# Patient Record
Sex: Male | Born: 1980 | Race: White | Hispanic: No | Marital: Married | Smoking: Current every day smoker
Health system: Southern US, Community
[De-identification: ages and names within clinical notes are randomized; demographics above are authoritative.]

## PROBLEM LIST (undated history)

## (undated) DIAGNOSIS — R079 Chest pain, unspecified: Secondary | ICD-10-CM

## (undated) DIAGNOSIS — A4902 Methicillin resistant Staphylococcus aureus infection, unspecified site: Secondary | ICD-10-CM

## (undated) DIAGNOSIS — Z720 Tobacco use: Secondary | ICD-10-CM

## (undated) DIAGNOSIS — L509 Urticaria, unspecified: Secondary | ICD-10-CM

## (undated) DIAGNOSIS — K219 Gastro-esophageal reflux disease without esophagitis: Secondary | ICD-10-CM

## (undated) DIAGNOSIS — D696 Thrombocytopenia, unspecified: Secondary | ICD-10-CM

## (undated) DIAGNOSIS — Z8781 Personal history of (healed) traumatic fracture: Secondary | ICD-10-CM

## (undated) DIAGNOSIS — I10 Essential (primary) hypertension: Secondary | ICD-10-CM

## (undated) DIAGNOSIS — T783XXA Angioneurotic edema, initial encounter: Secondary | ICD-10-CM

## (undated) HISTORY — DX: Urticaria, unspecified: L50.9

## (undated) HISTORY — PX: UMBILICAL HERNIA REPAIR: SHX196

## (undated) HISTORY — PX: OTHER SURGICAL HISTORY: SHX169

## (undated) HISTORY — DX: Angioneurotic edema, initial encounter: T78.3XXA

---

## 2004-04-04 ENCOUNTER — Ambulatory Visit (HOSPITAL_BASED_OUTPATIENT_CLINIC_OR_DEPARTMENT_OTHER): Admission: RE | Admit: 2004-04-04 | Discharge: 2004-04-04 | Payer: Self-pay | Admitting: Orthopedic Surgery

## 2004-04-04 ENCOUNTER — Ambulatory Visit (HOSPITAL_COMMUNITY): Admission: RE | Admit: 2004-04-04 | Discharge: 2004-04-04 | Payer: Self-pay | Admitting: Orthopedic Surgery

## 2004-04-05 ENCOUNTER — Ambulatory Visit: Payer: Self-pay | Admitting: Internal Medicine

## 2004-04-05 ENCOUNTER — Ambulatory Visit: Payer: Self-pay | Admitting: Infectious Diseases

## 2004-04-05 ENCOUNTER — Inpatient Hospital Stay (HOSPITAL_COMMUNITY): Admission: AD | Admit: 2004-04-05 | Discharge: 2004-04-11 | Payer: Self-pay | Admitting: Internal Medicine

## 2004-04-08 ENCOUNTER — Ambulatory Visit: Payer: Self-pay | Admitting: Infectious Diseases

## 2004-04-18 ENCOUNTER — Ambulatory Visit: Payer: Self-pay | Admitting: Infectious Diseases

## 2004-06-07 ENCOUNTER — Ambulatory Visit: Payer: Self-pay | Admitting: Infectious Diseases

## 2004-11-30 ENCOUNTER — Ambulatory Visit: Payer: Self-pay | Admitting: Internal Medicine

## 2005-01-01 ENCOUNTER — Ambulatory Visit: Payer: Self-pay | Admitting: Internal Medicine

## 2005-02-26 ENCOUNTER — Ambulatory Visit: Payer: Self-pay | Admitting: Infectious Diseases

## 2007-10-12 ENCOUNTER — Emergency Department (HOSPITAL_COMMUNITY): Admission: EM | Admit: 2007-10-12 | Discharge: 2007-10-12 | Payer: Self-pay | Admitting: Emergency Medicine

## 2010-06-13 ENCOUNTER — Observation Stay (HOSPITAL_COMMUNITY)
Admission: EM | Admit: 2010-06-13 | Discharge: 2010-06-14 | Payer: Self-pay | Source: Home / Self Care | Attending: Internal Medicine | Admitting: Internal Medicine

## 2010-06-14 LAB — POCT I-STAT, CHEM 8
BUN: 16 mg/dL (ref 6–23)
Calcium, Ion: 1.19 mmol/L (ref 1.12–1.32)
Chloride: 107 mEq/L (ref 96–112)
Creatinine, Ser: 2.5 mg/dL — ABNORMAL HIGH (ref 0.4–1.5)
Glucose, Bld: 86 mg/dL (ref 70–99)
HCT: 49 % (ref 39.0–52.0)
Hemoglobin: 16.7 g/dL (ref 13.0–17.0)
Potassium: 4.2 mEq/L (ref 3.5–5.1)
Sodium: 140 mEq/L (ref 135–145)
TCO2: 24 mmol/L (ref 0–100)

## 2010-06-14 LAB — CK TOTAL AND CKMB (NOT AT ARMC)
CK, MB: 1.6 ng/mL (ref 0.3–4.0)
Relative Index: INVALID (ref 0.0–2.5)
Total CK: 95 U/L (ref 7–232)

## 2010-06-14 LAB — RAPID URINE DRUG SCREEN, HOSP PERFORMED
Amphetamines: NOT DETECTED
Barbiturates: NOT DETECTED
Benzodiazepines: NOT DETECTED
Cocaine: NOT DETECTED
Opiates: NOT DETECTED
Tetrahydrocannabinol: POSITIVE — AB

## 2010-06-14 LAB — DIFFERENTIAL
Basophils Absolute: 0.1 10*3/uL (ref 0.0–0.1)
Basophils Relative: 1 % (ref 0–1)
Eosinophils Absolute: 0.3 10*3/uL (ref 0.0–0.7)
Eosinophils Relative: 2 % (ref 0–5)
Lymphocytes Relative: 13 % (ref 12–46)
Lymphs Abs: 1.9 10*3/uL (ref 0.7–4.0)
Monocytes Absolute: 0.7 10*3/uL (ref 0.1–1.0)
Monocytes Relative: 5 % (ref 3–12)
Neutro Abs: 11.8 10*3/uL — ABNORMAL HIGH (ref 1.7–7.7)
Neutrophils Relative %: 79 % — ABNORMAL HIGH (ref 43–77)

## 2010-06-14 LAB — POCT CARDIAC MARKERS
CKMB, poc: <1 ng/mL — ABNORMAL LOW (ref 1.0–8.0)
Myoglobin, poc: 87.5 ng/mL (ref 12–200)
Troponin i, poc: <0.05 ng/mL (ref 0.00–0.09)

## 2010-06-14 LAB — URINALYSIS, ROUTINE W REFLEX MICROSCOPIC
Bilirubin Urine: NEGATIVE
Hgb urine dipstick: NEGATIVE
Ketones, ur: NEGATIVE mg/dL
Nitrite: NEGATIVE
Protein, ur: NEGATIVE mg/dL
Specific Gravity, Urine: 1.018 (ref 1.005–1.030)
Urine Glucose, Fasting: NEGATIVE mg/dL
Urobilinogen, UA: 0.2 mg/dL (ref 0.0–1.0)
pH: 6 (ref 5.0–8.0)

## 2010-06-14 LAB — CARBOXYHEMOGLOBIN
Carboxyhemoglobin: 3.7 % — ABNORMAL HIGH (ref 0.5–1.5)
Methemoglobin: 1.4 % (ref 0.0–1.5)
O2 Saturation: 98.2 %
Total hemoglobin: 14.9 g/dL (ref 13.5–18.0)

## 2010-06-14 LAB — CBC
HCT: 48.2 % (ref 39.0–52.0)
Hemoglobin: 16 g/dL (ref 13.0–17.0)
MCH: 30.4 pg (ref 26.0–34.0)
MCHC: 33.2 g/dL (ref 30.0–36.0)
MCV: 91.6 fL (ref 78.0–100.0)
Platelets: 56 10*3/uL — ABNORMAL LOW (ref 150–400)
RBC: 5.26 MIL/uL (ref 4.22–5.81)
RDW: 13.7 % (ref 11.5–15.5)
WBC: 14.8 10*3/uL — ABNORMAL HIGH (ref 4.0–10.5)

## 2010-06-14 LAB — MRSA PCR SCREENING: MRSA by PCR: NEGATIVE
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
## 2010-06-19 LAB — VITAMIN B12: Vitamin B-12: 385 pg/mL (ref 211–911)

## 2010-06-19 LAB — BASIC METABOLIC PANEL
BUN: 11 mg/dL (ref 6–23)
CO2: 23 mEq/L (ref 19–32)
Calcium: 8.6 mg/dL (ref 8.4–10.5)
Chloride: 111 mEq/L (ref 96–112)
Creatinine, Ser: 1.1 mg/dL (ref 0.4–1.5)
GFR calc Af Amer: >60 mL/min (ref >60)
GFR calc non Af Amer: >60 mL/min (ref >60)
Glucose, Bld: 105 mg/dL — ABNORMAL HIGH (ref 70–99)
Potassium: 4 mEq/L (ref 3.5–5.1)
Sodium: 140 mEq/L (ref 135–145)

## 2010-06-19 LAB — HEPATIC FUNCTION PANEL
ALT: 11 U/L (ref 0–53)
AST: 19 U/L (ref 0–37)
Albumin: 3.6 g/dL (ref 3.5–5.2)
Alkaline Phosphatase: 54 U/L (ref 39–117)
Bilirubin, Direct: 0.1 mg/dL (ref 0.0–0.3)
Indirect Bilirubin: 0.4 mg/dL (ref 0.3–0.9)
Total Bilirubin: 0.5 mg/dL (ref 0.3–1.2)
Total Protein: 6.2 g/dL (ref 6.0–8.3)

## 2010-06-19 LAB — CARDIAC PANEL(CRET KIN+CKTOT+MB+TROPI)
CK, MB: 1.4 ng/mL (ref 0.3–4.0)
CK, MB: 1.1 ng/mL (ref 0.3–4.0)
Relative Index: INVALID (ref 0.0–2.5)
Relative Index: INVALID (ref 0.0–2.5)
Total CK: 85 U/L (ref 7–232)
Total CK: 90 U/L (ref 7–232)
Troponin I: 0.02 ng/mL (ref 0.00–0.06)
Troponin I: 0.01 ng/mL (ref 0.00–0.06)

## 2010-06-19 LAB — LIPID PANEL
HDL: 32 mg/dL — ABNORMAL LOW
Total CHOL/HDL Ratio: 3.4 ratio
Triglycerides: 93 mg/dL
VLDL: 19 mg/dL (ref 0–40)

## 2010-06-19 LAB — DIFFERENTIAL
Basophils Absolute: 0.1 10*3/uL (ref 0.0–0.1)
Basophils Relative: 1 % (ref 0–1)
Eosinophils Absolute: 0.5 10*3/uL (ref 0.0–0.7)
Eosinophils Relative: 7 % — ABNORMAL HIGH (ref 0–5)
Lymphocytes Relative: 29 % (ref 12–46)
Lymphs Abs: 2.1 10*3/uL (ref 0.7–4.0)
Monocytes Absolute: 0.5 10*3/uL (ref 0.1–1.0)
Monocytes Relative: 7 % (ref 3–12)
Neutro Abs: 4.1 10*3/uL (ref 1.7–7.7)
Neutrophils Relative %: 55 % (ref 43–77)

## 2010-06-19 LAB — CK TOTAL AND CKMB (NOT AT ARMC)
CK, MB: 1.5 ng/mL (ref 0.3–4.0)
Relative Index: INVALID (ref 0.0–2.5)
Total CK: 99 U/L (ref 7–232)

## 2010-06-19 LAB — FOLATE: Folate: 10.9 ng/mL

## 2010-06-19 LAB — CARBOXYHEMOGLOBIN
Carboxyhemoglobin: 1.2 % (ref 0.5–1.5)
Methemoglobin: 1.7 % — ABNORMAL HIGH (ref 0.0–1.5)
O2 Saturation: 97.7 %
Total hemoglobin: 15.8 g/dL (ref 13.5–18.0)

## 2010-06-19 LAB — CBC
HCT: 45.3 % (ref 39.0–52.0)
Hemoglobin: 14.8 g/dL (ref 13.0–17.0)
MCH: 29.6 pg (ref 26.0–34.0)
MCHC: 32.7 g/dL (ref 30.0–36.0)
MCV: 90.6 fL (ref 78.0–100.0)
Platelets: 54 10*3/uL — ABNORMAL LOW (ref 150–400)
RBC: 5 MIL/uL (ref 4.22–5.81)
RDW: 13.7 % (ref 11.5–15.5)
WBC: 7.4 10*3/uL (ref 4.0–10.5)

## 2010-06-19 LAB — URINE CULTURE
Colony Count: NO GROWTH
Culture  Setup Time: 201201180122
Culture: NO GROWTH
Special Requests: NEGATIVE

## 2010-06-19 LAB — TSH: TSH: 2.414 u[IU]/mL (ref 0.350–4.500)

## 2010-06-19 LAB — HIV ANTIBODY (ROUTINE TESTING W REFLEX): HIV: NONREACTIVE
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
## 2010-10-13 NOTE — Op Note (Signed)
NAMEDIA, JEFFERYS NO.:  [id number]   MEDICAL RECORD NO.:  [id number]          PATIENT TYPE:  AMB   LOCATION:  DSC                          FACILITY:  [facility]   PHYSICIAN:  Cindee Salt, M.D.       DATE OF BIRTH:  September 12, 1980   DATE OF PROCEDURE:  DATE OF DISCHARGE:                                 OPERATIVE REPORT   DATE OF OPERATION:  April 04, 2004.   PREOPERATIVE DIAGNOSIS:  Infection, left middle finger.   POSTOPERATIVE DIAGNOSIS:  Infection, left middle finger.   OPERATION:  Incision and drainage, left middle finger.   SURGEON:  Cindee Salt, MD.   Threasa HeadsCarolyne Fiscal.   ANESTHESIA:  Metacarpal block.   HISTORY:  The patient is a 30 year old male with a five-day history of  infection of his left middle finger, middle phalanx, volar aspect.  He has  been treated for multiple abscesses and is referred by Dr. __________.   PROCEDURE:  The patient was brought to the operating room where a metacarpal  block of 1% Xylocaine was given.  He was prepped using Duraprep.  In the  supine position, left arm free, a Penrose drain was used for tourniquet  control.  At the base of the finger, an oblique incision was made over the  area of swelling, carried down through subcutaneous tissue.  Purulent  material was immediately encountered.  Cultures were taken for both aerobic  and anaerobic cultures.  The wound was irrigated and packed with Iodoform, a  sterile, compressive dressing and splint were applied.  The patient  tolerated the procedure well and was discharged home to return to the [facility] [place] on Friday.  He has been given prescriptions for Vicodin  and Bactrim.       GK/MEDQ  D:  04/04/2004  T:  04/04/2004  Job:  [id number]
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
## 2011-02-21 LAB — URINE CULTURE: Colony Count: NO GROWTH

## 2011-02-21 LAB — URINALYSIS, ROUTINE W REFLEX MICROSCOPIC
Bilirubin Urine: NEGATIVE
Glucose, UA: NEGATIVE
Hgb urine dipstick: NEGATIVE
Specific Gravity, Urine: 1.015
pH: 6.5

## 2012-06-08 ENCOUNTER — Emergency Department (HOSPITAL_COMMUNITY)
Admission: EM | Admit: 2012-06-08 | Discharge: 2012-06-08 | Disposition: A | Discharge status: Home / Self Care | Payer: Self-pay | Attending: Emergency Medicine | Admitting: Emergency Medicine

## 2012-06-08 ENCOUNTER — Emergency Department (HOSPITAL_COMMUNITY): Payer: Self-pay

## 2012-06-08 ENCOUNTER — Encounter (HOSPITAL_COMMUNITY): Payer: Self-pay | Admitting: *Deleted

## 2012-06-08 DIAGNOSIS — F17200 Nicotine dependence, unspecified, uncomplicated: Secondary | ICD-10-CM | POA: Insufficient documentation

## 2012-06-08 DIAGNOSIS — Y9389 Activity, other specified: Secondary | ICD-10-CM | POA: Insufficient documentation

## 2012-06-08 DIAGNOSIS — Z8781 Personal history of (healed) traumatic fracture: Secondary | ICD-10-CM | POA: Insufficient documentation

## 2012-06-08 DIAGNOSIS — S022XXA Fracture of nasal bones, initial encounter for closed fracture: Secondary | ICD-10-CM | POA: Insufficient documentation

## 2012-06-08 DIAGNOSIS — Z8614 Personal history of Methicillin resistant Staphylococcus aureus infection: Secondary | ICD-10-CM | POA: Insufficient documentation

## 2012-06-08 HISTORY — DX: Methicillin resistant Staphylococcus aureus infection, unspecified site: A49.02

## 2012-06-08 HISTORY — DX: Personal history of (healed) traumatic fracture: Z87.81

## 2012-06-08 MED ORDER — HYDROCODONE-ACETAMINOPHEN 5-325 MG PO TABS
1.0000 | ORAL_TABLET | Freq: Once | ORAL | Status: AC
  Administered 2012-06-08: 1 via ORAL
  Filled 2012-06-08: qty 1

## 2012-06-08 MED ORDER — HYDROCODONE-ACETAMINOPHEN 5-325 MG PO TABS: 1.0000 | ORAL_TABLET | ORAL | Status: AC | PRN
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
## 2012-06-09 NOTE — ED Provider Notes (Signed)
Medical screening examination/treatment/procedure(s) were performed by non-physician practitioner and as supervising physician I was immediately available for consultation/collaboration.  Tahlor Berenguer, MD 06/09/12 2211 

## 2013-10-14 ENCOUNTER — Emergency Department (HOSPITAL_COMMUNITY)
Admission: EM | Admit: 2013-10-14 | Discharge: 2013-10-14 | Disposition: A | Discharge status: Home / Self Care | Payer: 59 | Attending: Emergency Medicine | Admitting: Emergency Medicine

## 2013-10-14 ENCOUNTER — Encounter (HOSPITAL_COMMUNITY): Payer: Self-pay | Admitting: Emergency Medicine

## 2013-10-14 DIAGNOSIS — Z8619 Personal history of other infectious and parasitic diseases: Secondary | ICD-10-CM | POA: Insufficient documentation

## 2013-10-14 DIAGNOSIS — Y92410 Unspecified street and highway as the place of occurrence of the external cause: Secondary | ICD-10-CM | POA: Insufficient documentation

## 2013-10-14 DIAGNOSIS — F17200 Nicotine dependence, unspecified, uncomplicated: Secondary | ICD-10-CM | POA: Insufficient documentation

## 2013-10-14 DIAGNOSIS — Z792 Long term (current) use of antibiotics: Secondary | ICD-10-CM | POA: Insufficient documentation

## 2013-10-14 DIAGNOSIS — Y9389 Activity, other specified: Secondary | ICD-10-CM | POA: Insufficient documentation

## 2013-10-14 DIAGNOSIS — S6990XA Unspecified injury of unspecified wrist, hand and finger(s), initial encounter: Secondary | ICD-10-CM | POA: Insufficient documentation

## 2013-10-14 DIAGNOSIS — S59919A Unspecified injury of unspecified forearm, initial encounter: Principal | ICD-10-CM

## 2013-10-14 DIAGNOSIS — R51 Headache: Secondary | ICD-10-CM | POA: Insufficient documentation

## 2013-10-14 DIAGNOSIS — S59909A Unspecified injury of unspecified elbow, initial encounter: Secondary | ICD-10-CM | POA: Insufficient documentation

## 2013-10-14 DIAGNOSIS — Z8781 Personal history of (healed) traumatic fracture: Secondary | ICD-10-CM | POA: Insufficient documentation
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
## 2013-10-14 NOTE — ED Provider Notes (Signed)
Medical screening examination/treatment/procedure(s) were performed by non-physician practitioner and as supervising physician I was immediately available for consultation/collaboration.   EKG Interpretation None        Adelina Collard L Sanaya Gwilliam, MD 10/14/13 1309 
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
## 2014-04-09 ENCOUNTER — Ambulatory Visit (HOSPITAL_COMMUNITY)
Admission: RE | Admit: 2014-04-09 | Discharge: 2014-04-09 | Disposition: A | Discharge status: Home / Self Care | Payer: 59 | Source: Ambulatory Visit | Attending: Family Medicine | Admitting: Family Medicine

## 2014-04-09 ENCOUNTER — Other Ambulatory Visit (HOSPITAL_COMMUNITY): Payer: Self-pay | Admitting: Family Medicine

## 2014-04-09 ENCOUNTER — Encounter (INDEPENDENT_AMBULATORY_CARE_PROVIDER_SITE_OTHER): Payer: Self-pay

## 2014-04-09 DIAGNOSIS — R05 Cough: Secondary | ICD-10-CM | POA: Insufficient documentation

## 2014-04-09 DIAGNOSIS — J209 Acute bronchitis, unspecified: Secondary | ICD-10-CM

## 2014-04-09 DIAGNOSIS — R0989 Other specified symptoms and signs involving the circulatory and respiratory systems: Secondary | ICD-10-CM | POA: Diagnosis not present

## 2015-01-18 ENCOUNTER — Other Ambulatory Visit (HOSPITAL_COMMUNITY): Payer: Self-pay | Admitting: Physician Assistant

## 2015-01-18 ENCOUNTER — Ambulatory Visit (HOSPITAL_COMMUNITY)
Admission: RE | Admit: 2015-01-18 | Discharge: 2015-01-18 | Disposition: A | Discharge status: Home / Self Care | Payer: BLUE CROSS/BLUE SHIELD | Source: Ambulatory Visit | Attending: Physician Assistant | Admitting: Physician Assistant

## 2015-01-18 DIAGNOSIS — R197 Diarrhea, unspecified: Secondary | ICD-10-CM | POA: Diagnosis not present

## 2015-01-18 DIAGNOSIS — R1013 Epigastric pain: Secondary | ICD-10-CM | POA: Diagnosis not present

## 2015-03-08 ENCOUNTER — Encounter (INDEPENDENT_AMBULATORY_CARE_PROVIDER_SITE_OTHER): Payer: Self-pay | Admitting: *Deleted

## 2015-04-05 ENCOUNTER — Encounter (INDEPENDENT_AMBULATORY_CARE_PROVIDER_SITE_OTHER): Payer: Self-pay | Admitting: *Deleted

## 2015-04-05 ENCOUNTER — Ambulatory Visit (INDEPENDENT_AMBULATORY_CARE_PROVIDER_SITE_OTHER): Payer: BLUE CROSS/BLUE SHIELD | Admitting: Internal Medicine

## 2015-04-05 ENCOUNTER — Other Ambulatory Visit (INDEPENDENT_AMBULATORY_CARE_PROVIDER_SITE_OTHER): Payer: Self-pay | Admitting: Internal Medicine

## 2015-04-05 ENCOUNTER — Encounter (INDEPENDENT_AMBULATORY_CARE_PROVIDER_SITE_OTHER): Payer: Self-pay | Admitting: Internal Medicine

## 2015-04-05 VITALS — BP 108/80 | HR 72 | Temp 97.4°F | Ht 68.0 in | Wt 202.2 lb

## 2015-04-05 DIAGNOSIS — D696 Thrombocytopenia, unspecified: Secondary | ICD-10-CM | POA: Diagnosis not present

## 2015-04-05 DIAGNOSIS — K219 Gastro-esophageal reflux disease without esophagitis: Secondary | ICD-10-CM

## 2015-04-05 DIAGNOSIS — K589 Irritable bowel syndrome without diarrhea: Secondary | ICD-10-CM | POA: Insufficient documentation

## 2015-04-05 MED ORDER — OMEPRAZOLE 40 MG PO CPDR: 40.0000 mg | DELAYED_RELEASE_CAPSULE | Freq: Every day | ORAL | Status: DC

## 2015-04-05 NOTE — Progress Notes (Signed)
   Subjective:    Patient ID: Derrick [name], male    DOB: 01/28/1981, 34 y.o.   MRN: [id number]  HPI Referred by Dr. Phillips OdorGolding for abdominal pain/loose stools. Presents with c/o that sometimes he has a sour stomach.  He will have to take 2-3 Tums. He also says he has seen blood in his stools.  He usually has 1-2 two stools a day.  If something disagrees with his stomach he will  Have 2-3 stools a day. Stools are usually formed.  He says he cannot eat dairy products. He ha frequent heart burn.  He has had these symptoms for a couple of years on and off.  He is only avoiding dairy products.  He avoids very spicy foods. Appetite is good. No weight loss.  No family hx of Crohn's   [facility] Th[facility]ocytopenia [facility] He states his platelets are large.  01/18/2015 US abdomen: Upper abdominal pain , diarrhea. IMPRESSION: Normal abdominal ultrasound.   01/18/2015 H and H 15.3 and 44.8, Platelet ct 81.       01/24/2015 Celiac panel negative  Protein 6.8, Albumin 4.6, Bili 0.7 ALP 81, AST 15, ALT 14, Lipase 33, TSH 1.360 CBC    Component Value Date/Time   WBC 7.4 06/14/2010 0530   RBC 5.00 06/14/2010 0530   HGB 14.8 06/14/2010 0530   HCT 45.3 06/14/2010 0530   PLT * 06/14/2010 0530    54 REPEATED TO VERIFY PLATELET COUNT CONFIRMED BY SMEAR LARGE PLATELETS PRESENT   MCV 90.6 06/14/2010 0530   MCH 29.6 06/14/2010 0530   MCHC 32.7 06/14/2010 0530   RDW 13.7 06/14/2010 0530   LYMPHSABS 2.1 06/14/2010 0530   MONOABS 0.5 06/14/2010 0530   EOSABS 0.5 06/14/2010 0530   BASOSABS 0.1 06/14/2010 0530     Review of Systems Past Medical History  Diagnosis Date  . H/O fracture of nose   . MRSA infection     on legs,     No past surgical history on file.  Allergies  Allergen Reactions  . Bee Venom Swelling    No current outpatient prescriptions on file prior to visit.   No current facility-administered medications on file prior to visit.          Objective:   Physical Exam Blood pressure 108/80, pulse 72, temperature 97.4 F (36.3 C), height 5\' 8"  (1.727 m), weight 202 lb 3.2 oz (91.717 kg). Alert and oriented. Skin warm and dry. Oral mucosa is moist.   . Sclera anicteric, conjunctivae is pink. Thyroid not enlarged. No cervical lymphadenopathy. Lungs clear. Heart regular rate and rhythm.  Abdomen is soft. Bowel sounds are positive. No hepatomegaly. No abdominal masses felt. No tenderness.  No edema to lower extremities.   Rectal exam: no stool and guaiac negative.   Lot [id number] Ex 9/17      Assessment & Plan:  GERD. Am goingto start him on Omeprazole 40mg  daily. I think he needs an EGD to rule out PUD. GERD diet given to patient.

## 2015-04-05 NOTE — Patient Instructions (Signed)
EGD. The risks and benefits such as perforation, bleeding, and infection were reviewed with the patient and is agreeable. 

## 2015-04-06 ENCOUNTER — Encounter (INDEPENDENT_AMBULATORY_CARE_PROVIDER_SITE_OTHER): Payer: Self-pay

## 2015-04-07 ENCOUNTER — Encounter (INDEPENDENT_AMBULATORY_CARE_PROVIDER_SITE_OTHER): Payer: Self-pay

## 2015-04-15 ENCOUNTER — Ambulatory Visit (HOSPITAL_COMMUNITY)
Admission: RE | Admit: 2015-04-15 | Discharge: 2015-04-15 | Disposition: A | Discharge status: Home / Self Care | Payer: BLUE CROSS/BLUE SHIELD | Source: Ambulatory Visit | Attending: Internal Medicine | Admitting: Internal Medicine

## 2015-04-15 ENCOUNTER — Encounter (HOSPITAL_COMMUNITY)
Admission: RE | Disposition: A | Discharge status: Home / Self Care | Payer: Self-pay | Source: Ambulatory Visit | Attending: Internal Medicine

## 2015-04-15 ENCOUNTER — Encounter (HOSPITAL_COMMUNITY): Payer: Self-pay | Admitting: *Deleted

## 2015-04-15 DIAGNOSIS — K2990 Gastroduodenitis, unspecified, without bleeding: Secondary | ICD-10-CM | POA: Insufficient documentation

## 2015-04-15 DIAGNOSIS — K2210 Ulcer of esophagus without bleeding: Secondary | ICD-10-CM | POA: Insufficient documentation

## 2015-04-15 DIAGNOSIS — K921 Melena: Secondary | ICD-10-CM | POA: Insufficient documentation

## 2015-04-15 DIAGNOSIS — R1110 Vomiting, unspecified: Secondary | ICD-10-CM | POA: Insufficient documentation

## 2015-04-15 DIAGNOSIS — D696 Thrombocytopenia, unspecified: Secondary | ICD-10-CM | POA: Insufficient documentation

## 2015-04-15 DIAGNOSIS — R1013 Epigastric pain: Secondary | ICD-10-CM | POA: Diagnosis present

## 2015-04-15 DIAGNOSIS — F17210 Nicotine dependence, cigarettes, uncomplicated: Secondary | ICD-10-CM | POA: Insufficient documentation

## 2015-04-15 DIAGNOSIS — R140 Abdominal distension (gaseous): Secondary | ICD-10-CM | POA: Insufficient documentation

## 2015-04-15 DIAGNOSIS — Z791 Long term (current) use of non-steroidal anti-inflammatories (NSAID): Secondary | ICD-10-CM | POA: Insufficient documentation

## 2015-04-15 DIAGNOSIS — Z79899 Other long term (current) drug therapy: Secondary | ICD-10-CM | POA: Diagnosis not present

## 2015-04-15 DIAGNOSIS — R110 Nausea: Secondary | ICD-10-CM | POA: Diagnosis not present

## 2015-04-15 DIAGNOSIS — K219 Gastro-esophageal reflux disease without esophagitis: Secondary | ICD-10-CM

## 2015-04-15 DIAGNOSIS — K210 Gastro-esophageal reflux disease with esophagitis: Secondary | ICD-10-CM | POA: Insufficient documentation

## 2015-04-15 DIAGNOSIS — K30 Functional dyspepsia: Secondary | ICD-10-CM | POA: Diagnosis not present

## 2015-04-15 DIAGNOSIS — K2950 Unspecified chronic gastritis without bleeding: Secondary | ICD-10-CM | POA: Diagnosis not present

## 2015-04-15 HISTORY — DX: Gastro-esophageal reflux disease without esophagitis: K21.9

## 2015-04-15 HISTORY — PX: ESOPHAGOGASTRODUODENOSCOPY: SHX5428

## 2015-04-15 SURGERY — EGD (ESOPHAGOGASTRODUODENOSCOPY)
Anesthesia: Moderate Sedation

## 2015-04-15 MED ORDER — MEPERIDINE HCL 50 MG/ML IJ SOLN
INTRAMUSCULAR | Status: AC
  Filled 2015-04-15: qty 1

## 2015-04-15 MED ORDER — PROMETHAZINE HCL 25 MG/ML IJ SOLN
INTRAMUSCULAR | Status: AC
  Filled 2015-04-15: qty 1

## 2015-04-15 MED ORDER — PROMETHAZINE HCL 25 MG/ML IJ SOLN
INTRAMUSCULAR | Status: DC | PRN
  Administered 2015-04-15: 25 mg via INTRAVENOUS

## 2015-04-15 MED ORDER — BUTAMBEN-TETRACAINE-BENZOCAINE 2-2-14 % EX AERO
INHALATION_SPRAY | CUTANEOUS | Status: DC | PRN
  Administered 2015-04-15: 2 via TOPICAL

## 2015-04-15 MED ORDER — MIDAZOLAM HCL 5 MG/5ML IJ SOLN
INTRAMUSCULAR | Status: DC | PRN
  Administered 2015-04-15 (×4): 3 mg via INTRAVENOUS

## 2015-04-15 MED ORDER — MIDAZOLAM HCL 5 MG/5ML IJ SOLN
INTRAMUSCULAR | Status: AC
  Filled 2015-04-15: qty 10

## 2015-04-15 MED ORDER — STERILE WATER FOR IRRIGATION IR SOLN
Status: DC | PRN
  Administered 2015-04-15: 14:00:00

## 2015-04-15 MED ORDER — SODIUM CHLORIDE 0.9 % IJ SOLN
INTRAMUSCULAR | Status: AC
  Filled 2015-04-15: qty 3

## 2015-04-15 MED ORDER — MIDAZOLAM HCL 5 MG/5ML IJ SOLN
INTRAMUSCULAR | Status: AC
  Filled 2015-04-15: qty 5

## 2015-04-15 MED ORDER — SODIUM CHLORIDE 0.9 % IV SOLN
INTRAVENOUS | Status: DC
  Administered 2015-04-15: 13:00:00 via INTRAVENOUS

## 2015-04-15 MED ORDER — MEPERIDINE HCL 50 MG/ML IJ SOLN
INTRAMUSCULAR | Status: DC | PRN
  Administered 2015-04-15 (×4): 25 mg

## 2015-04-15 NOTE — Op Note (Signed)
EGD PROCEDURE REPORT  PATIENT:  Derrick SmallingJohn P Kuntzman  MR#:  [id number] Birthdate:  [birth year], 34 y.o., male Endoscopist:  Dr. Malissa HippoNajeeb U. Kataleia Quaranta, MD Referred By:  Dr. Colette RibasJohn Cabot Golding, MD Procedure Date: 04/15/2015  Procedure:   EGD  Indications:  Patient is [age] old Caucasian male who presents with few year history of postprandial bloating discomfort regurgitation and heartburn no more than twice a week. He admits to eating too much at some of his meals. Recent ultrasound was negative for cholelithiasis. Lab data pertinent for thrombocytopenia which is chronic but his LFTs were normal.            Informed Consent:  The risks, benefits, alternatives & imponderables which include, but are not limited to, bleeding, infection, perforation, drug reaction and potential missed lesion have been reviewed.  The potential for biopsy, lesion removal, esophageal dilation, etc. have also been discussed.  Questions have been answered.  All parties agreeable.  Please see history & physical in medical record for more information.  Medications:  Demerol 100 mg IV Versed 12 mg IV Promethazine 25 mg IV and diluted form. Cetacaine spray topically for oropharyngeal anesthesia  Description of procedure:  The endoscope was introduced through the mouth and advanced to the second portion of the duodenum without difficulty or limitations. The mucosal surfaces were surveyed very carefully during advancement of the scope and upon withdrawal.  Findings:  Esophagus:  Mucosa of the proximal and middle segment was normal. Multiple erosions noted involving distal 3 cm of the esophagus as well as at GE junction. No stricture noted. GEJ:  41 cm Stomach:  Stomach was empty and distended very well with insufflation. Folds in the proximal stomach were normal. Examination of mucosa at gastric body was normal. Patchy and linear erythema noted to antral mucosa. Pyloric channel was patent. Angularis fundus and cardia were  unremarkable. Duodenum:  Patchy edema and erythema noted to bulbar mucosa. Post bulbar mucosa was normal.  Therapeutic/Diagnostic Maneuvers Performed:  None  Complications:  None  EBL: None  Impression: Erosive reflux esophagitis. Gastroduodenitis.  Recommendations:  Standard instructions given. Patient advised to begin omeprazole. Patient was given prescription at the time of office visit but has not started yet Anti-reflux measures. H. pylori serology. Office visit visit in 2 months.  Bhargav Barbaro U  04/15/2015  2:21 PM  CC: Dr. Phillips OdorGOLDING, Chancy HurterJOHN CABOT, MD & Dr. Bonnetta BarryNo ref. provider found

## 2015-04-15 NOTE — Discharge Instructions (Signed)
Anti-reflux measures. Omeprazole 40 mg by mouth 30 minutes before breakfast daily. Can take Pepcid OTC 20 mg or Zantac OTC 150 mg by mouth at bedtime on as-needed basis. No driving for 24 hours. Physician will call with results of blood test. Office visit in 2 months.  Esophagogastroduodenoscopy, Care After Refer to this sheet in the next few weeks. These instructions provide you with information about caring for yourself after your procedure. Your health care provider may also give you more specific instructions. Your treatment has been planned according to current medical practices, but problems sometimes occur. Call your health care provider if you have any problems or questions after your procedure. WHAT TO EXPECT AFTER THE PROCEDURE After your procedure, it is typical to feel:  Soreness in your throat.  Pain with swallowing.  Sick to your stomach (nauseous).  Bloated.  Dizzy.  Fatigued. HOME CARE INSTRUCTIONS  Do not eat or drink anything until the numbing medicine (local anesthetic) has worn off and your gag reflex has returned. You will know that the local anesthetic has worn off when you can swallow comfortably.  Do not drive or operate machinery until directed by your health care provider.  Take medicines only as directed by your health care provider. SEEK MEDICAL CARE IF:   You cannot stop coughing.  You are not urinating at all or less than usual. SEEK IMMEDIATE MEDICAL CARE IF:  You have difficulty swallowing.  You cannot eat or drink.  You have worsening throat or chest pain.  You have dizziness or lightheadedness or you faint.  You have nausea or vomiting.  You have chills.  You have a fever.  You have severe abdominal pain.  You have black, tarry, or bloody stools.   This information is not intended to replace advice given to you by your health care provider. Make sure you discuss any questions you have with your health care provider.

## 2015-04-15 NOTE — H&P (Signed)
Derrick [name] is an 34 y.o. male.   Chief Complaint: Patient is here for EGD. HPI: Patient is [age] old Caucasian male who presents with history of intermittent postprandial nausea bloating and regurgitation. He also has regurgitation and 90. He has heartburn at least twice a week. He admits to eating too much food at mealtime. What is had regurgitation but no vomiting. He denies melena. He's had intermittent hematochezia. Derrick [name] has one to 2 bowel movements per day. He has good appetite. He has gained 10 pounds in the last 6 months. He underwent upper abdominal ultrasound about 12 weeks ago and was negative for cholelithiasis. He smokes about a pack of cigarettes per day and drinks alcohol occasionally. He does not take OTC NSAIDs.  Past Medical History  Diagnosis Date  . H/O fracture of nose   . MRSA infection     on legs,   . GERD (gastroesophageal reflux disease)     Past Surgical History  Procedure Laterality Date  . Right ankle fracture      X 3  . Umbilical hernia repair    . Testicular tortion      History reviewed. No pertinent family history. Social History:  reports that he has been smoking Cigarettes.  He has a 20 pack-year smoking history. He does not have any smokeless tobacco history on file. He reports that he drinks alcohol. He reports that he does not use illicit drugs.  Allergies:  Allergies  Allergen Reactions  . Bee Venom Swelling    Medications Prior to Admission  Medication Sig Dispense Refill  . naproxen sodium (ALEVE) 220 MG tablet Take 220 mg by mouth daily as needed (pain).    . ranitidine (ZANTAC) 150 MG tablet Take 150 mg by mouth as needed for heartburn.    Marland Kitchen. omeprazole (PRILOSEC) 40 MG capsule Take 1 capsule (40 mg total) by mouth daily. (Patient not taking: Reported on 04/12/2015) 90 capsule 3    No results found for this or any previous visit (from the past 48 hour(s)). No results found.  ROS  Blood pressure 141/82, pulse 52, temperature 97.8 F  (36.6 C), temperature source Oral, resp. rate 15, height 5\' 8"  (1.727 m), weight 202 lb (91.627 kg), SpO2 100 %. Physical Exam  Constitutional: He appears well-developed and well-nourished.  HENT:  Mouth/Throat: Oropharynx is clear and moist.  Eyes: Conjunctivae are normal. No scleral icterus.  Neck: No thyromegaly present.  Cardiovascular: Normal rate, regular rhythm and normal heart sounds.   No murmur heard. Respiratory: Effort normal and breath sounds normal.  GI:  Abdomen is full but soft and nontender without organomegaly or masses. Small umbilicus hernia noted.  Musculoskeletal: He exhibits no edema.  Lymphadenopathy:    He has no cervical adenopathy.  Neurological: He is alert.  Skin: Skin is warm.     Assessment/Plan Chronic dyspepsia. Diagnostic EGD.  Derrick [name] U 04/15/2015, 1:44 PM

## 2015-04-16 LAB — H. PYLORI ANTIBODY, IGG

## 2015-04-18 ENCOUNTER — Encounter (INDEPENDENT_AMBULATORY_CARE_PROVIDER_SITE_OTHER): Payer: Self-pay | Admitting: *Deleted

## 2015-04-20 ENCOUNTER — Encounter (HOSPITAL_COMMUNITY): Payer: Self-pay | Admitting: Internal Medicine

## 2015-06-15 ENCOUNTER — Emergency Department (HOSPITAL_COMMUNITY): Payer: BLUE CROSS/BLUE SHIELD

## 2015-06-15 ENCOUNTER — Encounter (HOSPITAL_COMMUNITY): Payer: Self-pay | Admitting: Emergency Medicine

## 2015-06-15 ENCOUNTER — Observation Stay (HOSPITAL_COMMUNITY)
Admission: EM | Admit: 2015-06-15 | Discharge: 2015-06-16 | Disposition: A | Discharge status: Home / Self Care | Payer: BLUE CROSS/BLUE SHIELD | Attending: Internal Medicine | Admitting: Internal Medicine

## 2015-06-15 DIAGNOSIS — Z8781 Personal history of (healed) traumatic fracture: Secondary | ICD-10-CM | POA: Diagnosis not present

## 2015-06-15 DIAGNOSIS — Z720 Tobacco use: Secondary | ICD-10-CM | POA: Diagnosis not present

## 2015-06-15 DIAGNOSIS — Z8614 Personal history of Methicillin resistant Staphylococcus aureus infection: Secondary | ICD-10-CM | POA: Diagnosis not present

## 2015-06-15 DIAGNOSIS — R079 Chest pain, unspecified: Secondary | ICD-10-CM | POA: Diagnosis not present

## 2015-06-15 DIAGNOSIS — F17210 Nicotine dependence, cigarettes, uncomplicated: Secondary | ICD-10-CM | POA: Diagnosis not present

## 2015-06-15 DIAGNOSIS — K219 Gastro-esophageal reflux disease without esophagitis: Secondary | ICD-10-CM | POA: Diagnosis present

## 2015-06-15 DIAGNOSIS — I10 Essential (primary) hypertension: Secondary | ICD-10-CM | POA: Diagnosis not present

## 2015-06-15 DIAGNOSIS — D696 Thrombocytopenia, unspecified: Secondary | ICD-10-CM | POA: Diagnosis present

## 2015-06-15 DIAGNOSIS — K589 Irritable bowel syndrome without diarrhea: Secondary | ICD-10-CM | POA: Diagnosis present

## 2015-06-15 HISTORY — DX: Chest pain, unspecified: R07.9

## 2015-06-15 HISTORY — DX: Tobacco use: Z72.0

## 2015-06-15 LAB — CBC
HEMATOCRIT: 48.4 % (ref 39.0–52.0)
Hemoglobin: 16.3 g/dL (ref 13.0–17.0)
MCH: 29.2 pg (ref 26.0–34.0)
MCHC: 33.7 g/dL (ref 30.0–36.0)
MCV: 86.7 fL (ref 78.0–100.0)
PLATELETS: 70 10*3/uL — AB (ref 150–400)
RBC: 5.58 MIL/uL (ref 4.22–5.81)
RDW: 14 % (ref 11.5–15.5)
WBC: 9.4 10*3/uL (ref 4.0–10.5)

## 2015-06-15 LAB — BASIC METABOLIC PANEL
Anion gap: 11 (ref 5–15)
BUN: 14 mg/dL (ref 6–20)
CO2: 23 mmol/L (ref 22–32)
CREATININE: 1.25 mg/dL — AB (ref 0.61–1.24)
Calcium: 9.4 mg/dL (ref 8.9–10.3)
Chloride: 108 mmol/L (ref 101–111)
GFR calc Af Amer: >60 mL/min (ref >60)
Glucose, Bld: 117 mg/dL — ABNORMAL HIGH (ref 65–99)
POTASSIUM: 3.6 mmol/L (ref 3.5–5.1)
SODIUM: 142 mmol/L (ref 135–145)

## 2015-06-15 LAB — I-STAT TROPONIN, ED
TROPONIN I, POC: 0 ng/mL (ref 0.00–0.08)
Troponin i, poc: 0 ng/mL (ref 0.00–0.08)

## 2015-06-15 LAB — TROPONIN I

## 2015-06-15 MED ORDER — ACETAMINOPHEN 325 MG PO TABS
650.0000 mg | ORAL_TABLET | ORAL | Status: DC | PRN
  Administered 2015-06-15 – 2015-06-16 (×2): 650 mg via ORAL
  Filled 2015-06-15 (×2): qty 2

## 2015-06-15 MED ORDER — NICOTINE 14 MG/24HR TD PT24
14.0000 mg | MEDICATED_PATCH | TRANSDERMAL | Status: DC
  Administered 2015-06-15: 14 mg via TRANSDERMAL
  Filled 2015-06-15: qty 1

## 2015-06-15 MED ORDER — SODIUM CHLORIDE 0.9 % IV SOLN: 250.0000 mL | INTRAVENOUS | Status: DC | PRN

## 2015-06-15 MED ORDER — ONDANSETRON HCL 4 MG/2ML IJ SOLN: 4.0000 mg | Freq: Four times a day (QID) | INTRAMUSCULAR | Status: DC | PRN

## 2015-06-15 MED ORDER — NITROGLYCERIN 0.4 MG SL SUBL
0.4000 mg | SUBLINGUAL_TABLET | SUBLINGUAL | Status: DC | PRN
  Administered 2015-06-15 (×2): 0.4 mg via SUBLINGUAL
  Filled 2015-06-15: qty 1

## 2015-06-15 MED ORDER — ASPIRIN 81 MG PO CHEW
324.0000 mg | CHEWABLE_TABLET | ORAL | Status: AC
  Administered 2015-06-15: 324 mg via ORAL
  Filled 2015-06-15: qty 4

## 2015-06-15 MED ORDER — GI COCKTAIL ~~LOC~~
30.0000 mL | Freq: Once | ORAL | Status: AC
  Administered 2015-06-15: 30 mL via ORAL
  Filled 2015-06-15: qty 30

## 2015-06-15 MED ORDER — ASPIRIN EC 81 MG PO TBEC: 81.0000 mg | DELAYED_RELEASE_TABLET | Freq: Every day | ORAL | Status: DC

## 2015-06-15 MED ORDER — MORPHINE SULFATE (PF) 4 MG/ML IV SOLN
4.0000 mg | Freq: Once | INTRAVENOUS | Status: AC
  Administered 2015-06-15: 4 mg via INTRAVENOUS
  Filled 2015-06-15: qty 1

## 2015-06-15 MED ORDER — SODIUM CHLORIDE 0.9 % IJ SOLN
3.0000 mL | Freq: Two times a day (BID) | INTRAMUSCULAR | Status: DC
  Administered 2015-06-15: 3 mL via INTRAVENOUS

## 2015-06-15 MED ORDER — SODIUM CHLORIDE 0.9 % IJ SOLN: 3.0000 mL | INTRAMUSCULAR | Status: DC | PRN

## 2015-06-15 MED ORDER — ASPIRIN 300 MG RE SUPP: 300.0000 mg | RECTAL | Status: AC

## 2015-06-15 NOTE — ED Notes (Signed)
Patient states L chest pain that radiates to L jaw and back of head.  Patient denies any other symptoms.   Patient states this started last night.   Pain is intermittent.   Patient took tums last night with no relief.

## 2015-06-15 NOTE — H&P (Signed)
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 No SOB  No abdominal pain    Pt has smoked since teens  Currently 1 ppd  QUit for 1 year in past        Past Medical History  Diagnosis Date  . H/O fracture of nose   . MRSA infection     on legs,   . GERD (gastroesophageal reflux disease)      (Not in a hospital admission)     Infusions:   Allergies  Allergen Reactions  . Bee Venom Swelling    Social History   Social History  . Marital Status: Married    Spouse Name: N/A  . Number of Children: N/A  . Years of Education: N/A   Occupational History  . Not on file.   Social History Main Topics  . Smoking status: Current Every Day Smoker -- 0.50 packs/day for 20 years    Types: Cigarettes  . Smokeless tobacco: Not on file  . Alcohol Use: Yes     Comment: socially  . Drug Use: No  . Sexual Activity: Not on file   Other Topics Concern  . Not on file   Social History Narrative    No family history on file.  Grandfather with MI in 53s  ( maternal GF)  REVIEW OF SYSTEMS:  All systems reviewed  Negative to the above problem except as noted above.    PHYSICAL EXAM: Filed Vitals:   06/15/15 1330 06/15/15 1457  BP: 120/78 136/93  Pulse: 50 55  Temp:    Resp: 17 14    No intake or output data in the 24 hours ending 06/15/15  1459  General:  Well appearing. No respiratory difficulty HEENT: normal Neck: supple. no JVD. Carotids 2+ bilat; no bruits. No lymphadenopathy or thryomegaly appreciated. Cor: PMI nondisplaced. Regular rate & rhythm. No rubs, gallops or murmurs. Lungs: clear L chest:  Tender to palpation along mid clavicular line   Abdomen: soft, nontender, nondistended. No hepatosplenomegaly. No bruits or masses. Good bowel sounds. Extremities: no cyanosis, clubbing, rash, edema Neuro: alert & oriented x 3, cranial nerves grossly intact. moves all 4 extremities w/o difficulty. Affect pleasant.  ECG:  10:24  SR 74 bpm  T wave inversion III, AVF  New from 2012 13:36  SR  St changes have resolved     Results for orders placed or performed during the hospital encounter of 06/15/15 (from the past 24 hour(s))  I-stat troponin, ED (not at [facility], [facility])     Status: None   Collection Time: 06/15/15 10:33 AM  Result Value Ref Range   Troponin i, poc 0.00 0.00 - 0.08 ng/mL  Comment 3          Basic metabolic panel     Status: Abnormal   Collection Time: 06/15/15 10:37 AM  Result Value Ref Range   Sodium 142 135 - 145 mmol/L   Potassium 3.6 3.5 - 5.1 mmol/L   Chloride 108 101 - 111 mmol/L   CO2 23 22 - 32 mmol/L   Glucose, Bld 117 (H) 65 - 99 mg/dL   BUN 14 6 - 20 mg/dL   Creatinine, Ser 9.60 (H) 0.61 - 1.24 mg/dL   Calcium 9.4 8.9 - 45.4 mg/dL   GFR calc non Af Amer >60 >60 mL/min   GFR calc Af Amer >60 >60 mL/min   Anion gap 11 5 - 15  CBC     Status: Abnormal   Collection Time: 06/15/15 10:37 AM  Result Value Ref Range   WBC 9.4 4.0 - 10.5 K/uL   RBC 5.58 4.22 - 5.81 MIL/uL   Hemoglobin 16.3 13.0 - 17.0 g/dL   HCT 09.8 11.9 - 14.7 %   MCV 86.7 78.0 - 100.0 fL   MCH 29.2 26.0 - 34.0 pg   MCHC 33.7 30.0 - 36.0 g/dL   RDW 82.9 56.2 - 13.0 %   Platelets 70 (L) 150 - 400 K/uL  I-Stat Troponin, ED (not at [facility])     Status: None   Collection Time: 06/15/15  1:56 PM  Result Value Ref Range   Troponin  i, poc 0.00 0.00 - 0.08 ng/mL   Comment 3           Dg Chest 2 View  06/15/2015  CLINICAL DATA:  Left-sided chest pain EXAM: CHEST  2 VIEW COMPARISON:  April 09, 2014 FINDINGS: Lungs are clear. Heart size and pulmonary vascularity are normal. No adenopathy. There is mild mid thoracic dextroscoliosis with lower thoracic levoscoliosis. No pneumothorax. IMPRESSION: No edema or consolidation. Electronically Signed   By: Bretta Bang III M.D.   On: 06/15/2015 10:51     ASSESSMENT:  Pt is  A 35 yo with long history of smoking who presents with CP and neck pain Pt had chest pressure and L neck discomfort last night Today had stabbing transient L sided CP  Left lateral  Lasts seconds  Still with intermitt neck pain  Not associated  On exam, chest pain on left lateral sides appears muscular  Still with neck discomfort  BP was high earlier (new for him)  Improved now.  EKG with transient inferior T wave inversion  New   Recomm  Admit   R/O for MI  If R/o would recomm stress echo in AM If rules in then cath Follow BP   EKG in AM    2.  HTN  Follow  3.  Tob  Counselled on cessation  Pt knows risks  4.  HCM  LIpids in AM

## 2015-06-15 NOTE — ED Notes (Signed)
Attempted report 

## 2015-06-15 NOTE — ED Provider Notes (Signed)
CSN: [id number]     Arrival date & time 06/15/15  1018 History   First MD Initiated Contact with Patient 06/15/15 1041     Chief Complaint  Patient presents with  . Chest Pain   (Consider location/radiation/quality/duration/timing/severity/associated sxs/prior Treatment) HPI 35 y.o. male with a hx of GERD, presents to the Emergency Department today complaining of chest pain since last night. The pain is intermittent with 8/10 pain located on his L chest that radiates towards his L jaw. Spurts last for 10 seconds and then go away. States that it occurred before he went to sleep and was able to go to bed without difficulty. Woke up with no chest pain. Occurred again while he was at work. He checked his blood pressure and it was 182/112. He came to the ED due to the BP and pain to be evaluated. No FH of ACS. No F, N/V/D, No SOB, No abd Pain, No numbnes/tingling, has headache.   Past Medical History  Diagnosis Date  . H/O fracture of nose   . MRSA infection     on legs,   . GERD (gastroesophageal reflux disease)    Past Surgical History  Procedure Laterality Date  . Right ankle fracture      X 3  . Umbilical hernia repair    . Testicular tortion    . Esophagogastroduodenoscopy N/A 04/15/2015    Procedure: ESOPHAGOGASTRODUODENOSCOPY (EGD);  Surgeon: Malissa Hippo, MD;  Location: [facility];  Service: Endoscopy;  Laterality: N/A;  1055 - moved to 12:10 - Ann notified pt   No family history on file. Social History  Substance Use Topics  . Smoking status: Current Every Day Smoker -- 0.50 packs/day for 20 years    Types: Cigarettes  . Smokeless tobacco: None  . Alcohol Use: Yes     Comment: socially    Review of Systems 10 Systems reviewed and all are negative for acute change except as noted in the HPI.  Allergies  Bee venom  Home Medications   Prior to Admission medications   Medication Sig Start Date End Date Taking? Authorizing Provider  omeprazole (PRILOSEC) 40 MG  capsule Take 1 capsule (40 mg total) by mouth daily. Patient not taking: Reported on 04/12/2015 04/05/15   Terri L Setzer, NP   BP 156/102 mmHg  Pulse 59  Temp(Src) 97.8 F (36.6 C) (Oral)  Resp 18  SpO2 98%   Physical Exam  Constitutional: He is oriented to person, place, and time. He appears well-developed and well-nourished.  HENT:  Head: Normocephalic and atraumatic.  Eyes: EOM are normal.  Neck: Normal range of motion. Neck supple.  Cardiovascular: Normal rate, regular rhythm, S1 normal, S2 normal, normal heart sounds and normal pulses.   Pulmonary/Chest: Effort normal and breath sounds normal.  Abdominal: Soft. Bowel sounds are normal. There is no tenderness. There is negative Murphy's sign.  Musculoskeletal: Normal range of motion.  Neurological: He is alert and oriented to person, place, and time.  Skin: Skin is warm and dry.  Psychiatric: He has a normal mood and affect. His behavior is normal. Thought content normal.  Nursing note and vitals reviewed.  ED Course  Procedures (including critical care time) Labs Review Labs Reviewed  BASIC METABOLIC PANEL - Abnormal; Notable for the following:    Glucose, Bld 117 (*)    Creatinine, Ser 1.25 (*)    All other components within normal limits  CBC - Abnormal; Notable for the following:    Platelets 70 (*)  All other components within normal limits  I-STAT TROPOININ, ED  Rosezena Sensor, ED    Imaging Review Dg Chest 2 View  06/15/2015  CLINICAL DATA:  Left-sided chest pain EXAM: CHEST  2 VIEW COMPARISON:  April 09, 2014 FINDINGS: Lungs are clear. Heart size and pulmonary vascularity are normal. No adenopathy. There is mild mid thoracic dextroscoliosis with lower thoracic levoscoliosis. No pneumothorax. IMPRESSION: No edema or consolidation. Electronically Signed   By: Bretta Bang III M.D.   On: 06/15/2015 10:51   I have personally reviewed and evaluated these images and lab results as part of my medical  decision-making.   EKG Interpretation None      MDM  I have reviewed relevant laboratory values. I have reviewed relevant imaging studies. I personally interpreted the relevant EKG. I have reviewed the relevant previous healthcare records. I obtained HPI from historian. Patient discussed with supervising physician  ED Course: CBC, BMP, Trop CXR  Assessment: 51y M with hx GERD presents with chest pain since last night. No trauma. Heart score is 2 given hx and risk factors. PERC score negative. EKG showed some T wave depression from previous EKG in 2012. Initial Trop neg. CXR neg. Repeat Trop neg. Repeat EKG neg. CBC, BMP unremarkable. No symptoms currently while in ED. Consulted Cardiology and they recommended Admission due to EKG changes and CP with radiation.  Patient is in no acute distress. Vital Signs are stable. Patient is able to ambulate. Patient able to tolerate PO.    HEART Score for Major Cardiac Events in the next 6 weeks  History Highly Suspicious (+2) Moderately Suspicious (+1) Slightly Suspicious (0) 0  EKG Significant ST- Depression (+2) Non-specific repolarization disturbance (+1) Normal (0) 1  Age >65 (+2) 45-65 (+1) <45 (0) 0  Risk Factors* >3 Risk Factors or Hx Atherosclerotic Disease (+2) 1-2 Risk Factors (+1) No Risk Factors  Known (0) 1  Troponin >3 x normal limit (+2)_ 1-3x normal limit (+1) < normal limit (0) 0  *Risk Factors: Hypercholesterolemia, Hypertension, Diabetes Mellitus, Cigarette Smoking, Positive Family History, Obesity    Total Score: 2  Low: 0-3 Points Moderate : 4-6 Points High: 7-10 Points  PERC Rule for Pulmonary Embolism  1. Age greater than 30 years old (No = 0, Yes = 1) 0 2. Heart rate greater than 100 bpm (No = 0, Yes = 1) 0 3. Oxygen saturation on room air less than 95% (No = 0, Yes = 1) 0 4. Prior history of DVT or PE (No = 0, Yes = 1) 0 5. Recent trauma or surgery within 4 weeks (No = 0, Yes = 1) 0 6. Hemoptysis (No = 0, Yes =  1) 0 7. Exogenous estrogen (No = 0, Yes = 1) 0 8. Clinical signs suggesting DVT (No = 0, Yes = 1) 0   Disposition/Plan:  Admit Pt acknowledges and agrees with plan  Supervising Physician Alvira Monday, MD   Final diagnoses:  Chest pain, unspecified chest pain type    Audry Pili, PA-C 06/15/15 1551  Alvira Monday, MD 06/16/15 [phone]

## 2015-06-16 ENCOUNTER — Observation Stay (HOSPITAL_BASED_OUTPATIENT_CLINIC_OR_DEPARTMENT_OTHER): Payer: BLUE CROSS/BLUE SHIELD

## 2015-06-16 ENCOUNTER — Encounter (HOSPITAL_COMMUNITY): Payer: Self-pay | Admitting: Physician Assistant

## 2015-06-16 DIAGNOSIS — R079 Chest pain, unspecified: Secondary | ICD-10-CM | POA: Diagnosis not present

## 2015-06-16 DIAGNOSIS — Z720 Tobacco use: Secondary | ICD-10-CM | POA: Diagnosis present

## 2015-06-16 DIAGNOSIS — K219 Gastro-esophageal reflux disease without esophagitis: Secondary | ICD-10-CM | POA: Diagnosis present

## 2015-06-16 LAB — ECHOCARDIOGRAM STRESS TEST
CHL CUP RESTING HR STRESS: 59 {beats}/min
CHL RATE OF PERCEIVED EXERTION: 16
CSEPEDS: 6 s
Estimated workload: 14.9 METS
Exercise duration (min): 13 min
MPHR: 184 {beats}/min
Peak HR: 187 {beats}/min
Percent HR: 101 %

## 2015-06-16 LAB — LIPID PANEL
CHOL/HDL RATIO: 5.3 ratio
CHOLESTEROL: 116 mg/dL (ref 0–200)
HDL: 22 mg/dL — ABNORMAL LOW (ref >40)
LDL Cholesterol: 33 mg/dL (ref 0–99)
TRIGLYCERIDES: 307 mg/dL — AB (ref <150)
VLDL: 61 mg/dL — AB (ref 0–40)

## 2015-06-16 LAB — TROPONIN I
Troponin I: <0.03 ng/mL (ref <0.031)
Troponin I: <0.03 ng/mL (ref <0.031)

## 2015-06-16 MED ORDER — VARENICLINE TARTRATE 0.5 MG PO TABS
0.5000 mg | ORAL_TABLET | Freq: Two times a day (BID) | ORAL | Status: DC
Start: 2015-06-16 — End: 2016-06-18

## 2015-06-16 NOTE — Discharge Summary (Signed)
Discharge Summary    Patient ID: Derrick [name],  MRN: [id number], DOB/AGE: [birth year] 35 y.o.  Admit date: 06/15/2015 Discharge date: 06/16/2015  Primary Care Provider: Assunta Found CABOT Primary Cardiologist: New- seen by Dr. Tenny Craw   Discharge Diagnoses    Principal Problem:   Chest pain Active Problems:   IBS (irritable bowel syndrome)   Thrombocytopenia (HCC)   GERD (gastroesophageal reflux disease)   Tobacco abuse   Allergies Allergies  Allergen Reactions  . Bee Venom Swelling     History of Present Illness     Derrick [name] is a 35 yo with long history of smoking and past cardiac history who presented to [facility] yesterday (06/15/15) with CP and neck pain.  He is studying to become an EMT. The night prior to admission he had diner and went to bed and noticed pressure starting in abdomen that went into his L chest and neck. He took tums with no reliefand went to sleep. He woke up the next morning and went to work. 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 He also had associated intermittent L neck discomfort. He called his nurse friends who told him to go to the ED to be evaluated.   Hospital Course     Consultants: none  1. Chest pain: Troponin neg x2. EKG with transient inferior T wave inversionin ER that later resolved.  --  S/p stress echo during this admission that returned normal. He walked 13 minutes on Bruce protocol. ECG with no acute ST or TW changes.   2.HTN: his BP was elevated transiently in the ED. BP currently well controlled subsequently. Continue to monitor as an outpatient.  3. Tobacco Abuse: counseled on cessation. He would like to try chantix. I will call this in to his pharmacy  4.HLD: LDL excellent at 33. TG a little elevated and HDL low. Continue to follow with PCP  5. Hyperglycemia: HgA1c pending at discharge.  6. Thrombocytopenia: PLT 70; has a long  history of this per patient. No long term ASA.  7. Dispo: follow up with PCP  The patient has had an uncomplicated hospital course and is recovering well. He has been seen by Dr. Tenny Craw today and deemed ready for discharge home. All follow-up appointments have been scheduled. Smoking cessation was disscussed in length. Discharge medications are listed below.  _____________  Discharge Vitals Blood pressure 124/79, pulse 66, temperature 98 F (36.7 C), temperature source Oral, resp. rate 18, height  (1.727 m), weight 195 lb 3.2 oz (88.542 kg), SpO2 97 %.  Filed Weights   06/15/15 1900 06/16/15 0540  Weight: 196 lb 1.6 oz (88.95 kg) 195 lb 3.2 oz (88.542 kg)    Labs & Radiologic Studies     CBC  Recent Labs  06/15/15 1037  WBC 9.4  HGB 16.3  HCT 48.4  MCV 86.7  PLT 70*   Basic Metabolic Panel  Recent Labs  06/15/15 1037  NA 142  K 3.6  CL 108  CO2 23  GLUCOSE 117*  BUN 14  CREATININE 1.25*  CALCIUM 9.4   Cardiac Enzymes  Recent Labs  06/15/15 2203 06/16/15 0328  TROPONINI <0.03 <0.03   Fasting Lipid Panel  Recent Labs  06/16/15 0328  CHOL 116  HDL 22*  LDLCALC 33  TRIG 657*  CHOLHDL 5.3   Thyroid Function Tests  Dg Chest 2 View  06/15/2015  CLINICAL DATA:  Left-sided  chest pain EXAM: CHEST  2 VIEW COMPARISON:  April 09, 2014 FINDINGS: Lungs are clear. Heart size and pulmonary vascularity are normal. No adenopathy. There is mild mid thoracic dextroscoliosis with lower thoracic levoscoliosis. No pneumothorax. IMPRESSION: No edema or consolidation. Electronically Signed   By: Bretta Bang III M.D.   On: 06/15/2015 10:51     Diagnostic Studies/Procedures    Stress echo: formal read pending at discharge.    Disposition   Pt is being discharged home today in good condition.  Follow-up Plans & Appointments    Follow-up Information    Schedule an appointment as soon as possible for a visit with Colette Ribas, MD.   Specialty:   Family Medicine   Contact information:   [address] [place] [address] [phone]        Discharge Medications   Current Discharge Medication List    START taking these medications   Details  varenicline (CHANTIX) 0.5 MG tablet Take 1 tablet (0.5 mg total) by mouth 2 (two) times daily. Qty: 60 tablet, Refills: 6      CONTINUE these medications which have NOT CHANGED   Details  calcium carbonate (TUMS - DOSED IN MG ELEMENTAL CALCIUM) 500 MG chewable tablet Chew 1 tablet by mouth as needed for indigestion or heartburn.    Ibuprofen (ADVIL) 200 MG CAPS Take 200 mg by mouth 2 (two) times daily as needed (pain).    omeprazole (PRILOSEC) 40 MG capsule Take 1 capsule (40 mg total) by mouth daily. Qty: 90 capsule, Refills: 3   Associated Diagnoses: Gastroesophageal reflux disease without esophagitis    ranitidine (ZANTAC) 150 MG tablet Take 150 mg by mouth at bedtime.           Outstanding Labs/Studies   None.  Duration of Discharge Encounter   Greater than 30 minutes including physician time.  SignedCline Crock R PA-C 06/16/2015, 12:41 PM Pt seen and examined  I agree with findings as noted abvoe by K thompson CP does not appear to be cardiac Plan for pt to f/u with primary MD Counselled on tobacco cessation.   Dietrich Pates

## 2015-06-16 NOTE — Progress Notes (Signed)
Patient Name: Derrick [name] Date of Encounter: 06/16/2015     Active Problems:   Chest pain    SUBJECTIVE  Seen in H&V center for stress echo.  No chest pain currently. Tolerated procedure well.   CURRENT MEDS . aspirin EC  81 mg Oral Daily  . nicotine  14 mg Transdermal Q24H  . sodium chloride  3 mL Intravenous Q12H    OBJECTIVE  Filed Vitals:   06/15/15 1618 06/15/15 1900 06/15/15 2222 06/16/15 0540  BP: 139/97  113/66 124/79  Pulse: 55  56 66  Temp: 97.7 F (36.5 C)  98 F (36.7 C) 98 F (36.7 C)  TempSrc: Oral  Oral Oral  Resp: Height:   (1.727 m)    Weight:  196 lb 1.6 oz (88.95 kg)  195 lb 3.2 oz (88.542 kg)  SpO2: 100%  98% 97%    Intake/Output Summary (Last 24 hours) at 06/16/15 0951 Last data filed at 06/15/15 2300  Gross per 24 hour  Intake    720 ml  Output      2 ml  Net    718 ml   Filed Weights   06/15/15 1900 06/16/15 0540  Weight: 196 lb 1.6 oz (88.95 kg) 195 lb 3.2 oz (88.542 kg)    PHYSICAL EXAM  General: Pleasant, NAD. Neuro: Alert and oriented X 3. Moves all extremities spontaneously. Psych: Normal affect. HEENT:  Normal  Neck: Supple without bruits or JVD. Lungs:  Resp regular and unlabored, CTA. Heart: RRR no s3, s4, or murmurs. Abdomen: Soft, non-tender, non-distended, BS + x 4.  Extremities: No clubbing, cyanosis or edema. DP/PT/Radials 2+ and equal bilaterally.  Accessory Clinical Findings  CBC  Recent Labs  06/15/15 1037  WBC 9.4  HGB 16.3  HCT 48.4  MCV 86.7  PLT 70*   Basic Metabolic Panel  Recent Labs  06/15/15 1037  NA 142  K 3.6  CL 108  CO2 23  GLUCOSE 117*  BUN 14  CREATININE 1.25*  CALCIUM 9.4  Cardiac Enzymes  Recent Labs  06/15/15 2203 06/16/15 0328  TROPONINI <0.03 <0.03   Fasting Lipid Panel  Recent Labs  06/16/15 0328  CHOL 116  HDL 22*  LDLCALC 33  TRIG 161*  CHOLHDL 5.3    TELE  NSR  Radiology/Studies  Dg Chest 2 View  06/15/2015  CLINICAL DATA:   Left-sided chest pain EXAM: CHEST  2 VIEW COMPARISON:  April 09, 2014 FINDINGS: Lungs are clear. Heart size and pulmonary vascularity are normal. No adenopathy. There is mild mid thoracic dextroscoliosis with lower thoracic levoscoliosis. No pneumothorax. IMPRESSION: No edema or consolidation. Electronically Signed   By: Bretta Bang III M.D.   On: 06/15/2015 10:51    ASSESSMENT AND PLAN  Mr Kramp is a 35 yo with long history of smoking who presented to [facility] yesterday (06/15/15) with CP and neck pain.  1. Chest pain: Troponin neg x2. EKG with transient inferior T wave inversionin ER yesterday.  -- Seen in H&V center for stress echo this AM. He walked 13 minutes on Bruce protocol. ECG with no acute ST or TW changes. Two PVCs. Will await formal ECHO read.    2. HTN: BP currently well controlled  3.Tobacco Abuse: counseled on cessation   4. HLD: LDL excellent at 33. TG a little elevated and HDL low.   Watch carbs    5. Hyperglycemia: will get a HgA1c  6. Thrombocytopenia: PLT 70. Monitor  Stable  Dc aspirin    Billy Fischer PA-C  Pager [phone]  Pt seen and examined  Lungs CTA  Cardiac RRR  Ext no edema  Bp is better today   No CP   Stress echo shows excellent exercise tolerance Walked 13 min  Electrically negative  Echo normal OK to D/C Needs to stop tobacco  Will rx for Chantix F/U with Dr Alexis Frock

## 2015-06-16 NOTE — Discharge Instructions (Signed)
Chest Wall Pain °Chest wall pain is pain in or around the bones and muscles of your chest. Sometimes, an injury causes this pain. Sometimes, the cause may not be known. This pain may take several weeks or longer to get better. °HOME CARE °Pay attention to any changes in your symptoms. Take these actions to help with your pain: °· Rest as told by your doctor. °· Avoid activities that cause pain. Try not to use your chest, belly (abdominal), or side muscles to lift heavy things. °· If directed, apply ice to the painful area: °¨ Put ice in a plastic bag. °¨ Place a towel between your skin and the bag. °¨ Leave the ice on for 20 minutes, 2-3 times per day. °· Take over-the-counter and prescription medicines only as told by your doctor. °· Do not use tobacco products, including cigarettes, chewing tobacco, and e-cigarettes. If you need help quitting, ask your doctor. °· Keep all follow-up visits as told by your doctor. This is important. °GET HELP IF: °· You have a fever. °· Your chest pain gets worse. °· You have new symptoms. °GET HELP RIGHT AWAY IF: °· You feel sick to your stomach (nauseous) or you throw up (vomit). °· You feel sweaty or light-headed. °· You have a cough with phlegm (sputum) or you cough up blood. °· You are short of breath. °  °This information is not intended to replace advice given to you by your health care provider. Make sure you discuss any questions you have with your health care provider. °  °Document Released: 10/31/2007 Document Revised: 02/02/2015 Document Reviewed: 08/09/2014 °Elsevier Interactive Patient Education ©2016 [employer] ° °

## 2015-06-16 NOTE — Progress Notes (Signed)
*  PRELIMINARY RESULTS* Echocardiogram Echocardiogram Stress Test has been performed.  Derrick [name] 06/16/2015, 10:47 AM

## 2015-06-17 LAB — HEMOGLOBIN A1C
HEMOGLOBIN A1C: 5.6 % (ref 4.8–5.6)
MEAN PLASMA GLUCOSE: 114 mg/dL

## 2015-06-20 ENCOUNTER — Ambulatory Visit (INDEPENDENT_AMBULATORY_CARE_PROVIDER_SITE_OTHER): Payer: BLUE CROSS/BLUE SHIELD | Admitting: Internal Medicine

## 2015-06-20 ENCOUNTER — Encounter (INDEPENDENT_AMBULATORY_CARE_PROVIDER_SITE_OTHER): Payer: Self-pay | Admitting: Internal Medicine

## 2015-06-20 VITALS — BP 118/50 | HR 68 | Temp 97.7°F | Ht 68.0 in | Wt 199.4 lb

## 2015-06-20 DIAGNOSIS — K2100 Gastro-esophageal reflux disease with esophagitis, without bleeding: Secondary | ICD-10-CM

## 2015-06-20 NOTE — Progress Notes (Signed)
Subjective:    Patient ID: Derrick [name], male    DOB: Feb 23, 1981, 35 y.o.   MRN: [id number]  HPI Here today for f/u after EGD in November for postprandial bloating discomfort and heart burn.  He tells me he is better. He is taking Omeprazole and Zantac as ordered. He is avoiding GERD provoking foods. Tuesday night of last week  06/15/2015 he said he had jaw  And chest pain. He was evaluated by Cardiology and cardiac orgin was ruled out.  Stress test was normal on 06/16/2015.  His appetite is good. No weight loss. BMs are normal.  No melena or BRRB. He was started on a B/P medication today. He will call me and tell me the name of the B/P medication.    04/15/2015 EGD  Indications: Patient is 56 old Caucasian male who presents with few year history of postprandial bloating discomfort regurgitation and heartburn no more than twice a week. He admits to eating too much at some of his meals. Recent ultrasound was negative for cholelithiasis. Lab data pertinent for thrombocytopenia which is chronic but his LFTs were normal. Impression: Erosive reflux esophagitis. Gastroduodenitis. H. Pylori was negative.  Review of Systems Past Medical History  Diagnosis Date  . H/O fracture of nose   . MRSA infection     on legs,   . GERD (gastroesophageal reflux disease)   . Tobacco abuse   . Chest pain     a. 06/16/15: normal stress echo    Past Surgical History  Procedure Laterality Date  . Right ankle fracture      X 3  . Umbilical hernia repair    . Testicular tortion    . Esophagogastroduodenoscopy N/A 04/15/2015    Procedure: ESOPHAGOGASTRODUODENOSCOPY (EGD);  Surgeon: Malissa Hippo, MD;  Location: [facility];  Service: Endoscopy;  Laterality: N/A;  1055 - moved to 12:10 - Ann notified pt    Allergies  Allergen Reactions  . Bee Venom Swelling    Current Outpatient  Prescriptions on File Prior to Visit  Medication Sig Dispense Refill  . Ibuprofen (ADVIL) 200 MG CAPS Take 200 mg by mouth 2 (two) times daily as needed (pain).    Marland Kitchen omeprazole (PRILOSEC) 40 MG capsule Take 1 capsule (40 mg total) by mouth daily. 90 capsule 3  . ranitidine (ZANTAC) 150 MG tablet Take 150 mg by mouth at bedtime.    . varenicline (CHANTIX) 0.5 MG tablet Take 1 tablet (0.5 mg total) by mouth 2 (two) times daily. 60 tablet 6   No current facility-administered medications on file prior to visit.        Objective:   Physical Exam Blood pressure 118/50, pulse 68, temperature 97.7 F (36.5 C), height  (1.727 m), weight 199 lb 6.4 oz (90.447 kg). Alert and oriented. Skin warm and dry. Oral mucosa is moist.   . Sclera anicteric, conjunctivae is pink. Thyroid not enlarged. No cervical lymphadenopathy. Lungs clear. Heart regular rate and rhythm.  Abdomen is soft. Bowel sounds are positive. No hepatomegaly. No abdominal masses felt. No tenderness.  No edema to lower extremities.          Assessment & Plan:  GERD controlled at this time with Omeprazole. Continue the Zantac at night.  If you have further chest pain or Jaw pain f/u with cardiology or go to the ED if pain reoccurs. GERD diet was given to him at last OV. Do not eat after 7pm.  OV in 1 year.

## 2015-06-20 NOTE — Patient Instructions (Addendum)
His acid reflux is controlled for the most part. He will have to watch his diet.  He will continue to Omeprazole and the Zantac at night. He will have OV in 1 year.

## 2016-04-24 ENCOUNTER — Encounter (INDEPENDENT_AMBULATORY_CARE_PROVIDER_SITE_OTHER): Payer: Self-pay

## 2016-04-24 ENCOUNTER — Encounter (INDEPENDENT_AMBULATORY_CARE_PROVIDER_SITE_OTHER): Payer: Self-pay | Admitting: Internal Medicine

## 2016-05-04 ENCOUNTER — Other Ambulatory Visit (INDEPENDENT_AMBULATORY_CARE_PROVIDER_SITE_OTHER): Payer: Self-pay | Admitting: Internal Medicine

## 2016-05-04 DIAGNOSIS — K219 Gastro-esophageal reflux disease without esophagitis: Secondary | ICD-10-CM

## 2016-05-08 ENCOUNTER — Telehealth (INDEPENDENT_AMBULATORY_CARE_PROVIDER_SITE_OTHER): Payer: Self-pay | Admitting: Internal Medicine

## 2016-05-08 NOTE — Telephone Encounter (Signed)
[facility] called back to let us know [facility] and they [facility] this ready, so no need to call anything in.

## 2016-05-08 NOTE — Telephone Encounter (Signed)
Patient called, stated that he's out of his Omeprazole and needs a refill.  [phone]

## 2016-06-18 ENCOUNTER — Encounter (INDEPENDENT_AMBULATORY_CARE_PROVIDER_SITE_OTHER): Payer: Self-pay | Admitting: Internal Medicine

## 2016-06-18 ENCOUNTER — Ambulatory Visit (INDEPENDENT_AMBULATORY_CARE_PROVIDER_SITE_OTHER): Payer: Commercial Managed Care - HMO | Admitting: Internal Medicine

## 2016-06-18 VITALS — BP 122/90 | HR 64 | Temp 97.6°F | Ht 68.0 in | Wt 202.8 lb

## 2016-06-18 DIAGNOSIS — K2100 Gastro-esophageal reflux disease with esophagitis, without bleeding: Secondary | ICD-10-CM

## 2016-06-18 MED ORDER — OMEPRAZOLE 40 MG PO CPDR: 40.0000 mg | DELAYED_RELEASE_CAPSULE | Freq: Two times a day (BID) | ORAL | 11 refills | Status: DC

## 2016-06-18 NOTE — Progress Notes (Signed)
Subjective:    Patient ID: Derrick [name], male    DOB: [birth year], 36 y.o.   MRN: [id number] Wt 199 in January of 2017 HPI Here today for f/u. Hx of GERD. He was last seen in January of 2017.  He tells me his acid reflux is better. About 7pm every evening, his stomach becomes sour.  He avoid dairy products.  He eat around 6-8 pm. He does not lay down during this time. He is avoiding spicy foods.   04/15/2015 EGD  Indications: Patient is 89 old Caucasian male who presents with few year history of postprandial bloating discomfort regurgitation and heartburn no more than twice a week. He admits to eating too much at some of his meals. Recent ultrasound was negative for cholelithiasis. Lab data pertinent for thrombocytopenia which is chronic but his LFTs were normal. Impression: Erosive reflux esophagitis. Gastroduodenitis.   CBC    Component Value Date/Time   WBC 9.4 06/15/2015 1037   RBC 5.58 06/15/2015 1037   HGB 16.3 06/15/2015 1037   HCT 48.4 06/15/2015 1037   PLT 70 (L) 06/15/2015 1037   MCV 86.7 06/15/2015 1037   MCH 29.2 06/15/2015 1037   MCHC 33.7 06/15/2015 1037   RDW 14.0 06/15/2015 1037   LYMPHSABS 2.1 06/14/2010 0530   MONOABS 0.5 06/14/2010 0530   EOSABS 0.5 06/14/2010 0530   BASOSABS 0.1 06/14/2010 0530     Review of Systems Past Medical History:  Diagnosis Date  . Chest pain    a. 06/16/15: normal stress echo  . GERD (gastroesophageal reflux disease)   . H/O fracture of nose   . MRSA infection    on legs,   . Tobacco abuse     Past Surgical History:  Procedure Laterality Date  . ESOPHAGOGASTRODUODENOSCOPY N/A 04/15/2015   Procedure: ESOPHAGOGASTRODUODENOSCOPY (EGD);  Surgeon: Malissa HippoNajeeb U Rehman, MD;  Location: [facility];  Service: Endoscopy;  Laterality: N/A;  1055 - moved to 12:10 - Ann notified pt  . right ankle fracture     X 3  .  testicular tortion    . UMBILICAL HERNIA REPAIR      Allergies  Allergen Reactions  . Bee Venom Swelling    Current Outpatient Prescriptions on File Prior to Visit  Medication Sig Dispense Refill  . calcium carbonate (TUMS - DOSED IN MG ELEMENTAL CALCIUM) 500 MG chewable tablet Chew 1 tablet by mouth daily.    . diazepam (VALIUM) 2 MG tablet Take 2 mg by mouth as needed for anxiety.    . Ibuprofen (ADVIL) 200 MG CAPS Take 200 mg by mouth 2 (two) times daily as needed (pain).    Marland Kitchen. losartan (COZAAR) 100 MG tablet Take 50 mg by mouth daily.    Marland Kitchen. omeprazole (PRILOSEC) 40 MG capsule TAKE ONE CAPSULE BY MOUTH ONCE DAILY 90 capsule 3  . ranitidine (ZANTAC) 150 MG tablet Take 150 mg by mouth at bedtime.     No current facility-administered medications on file prior to visit.        Objective:   Physical Exam Blood pressure 122/90, pulse 64, temperature 97.6 F (36.4 C), height 5\' 8"  (1.727 m), weight 202 lb 12.8 oz (92 kg).  Alert and oriented. Skin warm and dry. Oral mucosa is moist.   . Sclera anicteric, conjunctivae is pink. Thyroid not enlarged. No cervical lymphadenopathy. Lungs clear. Heart regular rate and rhythm.  Abdomen is soft. Bowel sounds are positive. No hepatomegaly. No abdominal masses felt. No tenderness.  No edema  to lower extremities.         Assessment & Plan:  GERD. Am going to increase in Omeprazole to BID for break thru OV in 1 year.

## 2016-06-18 NOTE — Patient Instructions (Signed)
Rx for Omepraozole BID OV in 1 7 year

## 2016-06-19 ENCOUNTER — Ambulatory Visit (INDEPENDENT_AMBULATORY_CARE_PROVIDER_SITE_OTHER): Payer: BLUE CROSS/BLUE SHIELD | Admitting: Internal Medicine

## 2017-02-20 ENCOUNTER — Encounter: Payer: Self-pay | Admitting: Family Medicine

## 2017-02-20 ENCOUNTER — Ambulatory Visit (INDEPENDENT_AMBULATORY_CARE_PROVIDER_SITE_OTHER): Payer: 59 | Admitting: Family Medicine

## 2017-02-20 ENCOUNTER — Ambulatory Visit (INDEPENDENT_AMBULATORY_CARE_PROVIDER_SITE_OTHER)
Admission: RE | Admit: 2017-02-20 | Discharge: 2017-02-20 | Disposition: A | Discharge status: Home / Self Care | Payer: Commercial Managed Care - HMO | Source: Ambulatory Visit | Attending: Family Medicine | Admitting: Family Medicine

## 2017-02-20 ENCOUNTER — Encounter: Payer: Self-pay | Admitting: *Deleted

## 2017-02-20 VITALS — BP 138/86 | Ht 68.0 in

## 2017-02-20 DIAGNOSIS — M5416 Radiculopathy, lumbar region: Secondary | ICD-10-CM | POA: Insufficient documentation

## 2017-02-20 MED ORDER — KETOROLAC TROMETHAMINE 60 MG/2ML IM SOLN
60.0000 mg | Freq: Once | INTRAMUSCULAR | Status: AC
  Administered 2017-02-20: 60 mg via INTRAMUSCULAR

## 2017-02-20 MED ORDER — GABAPENTIN 100 MG PO CAPS: 200.0000 mg | ORAL_CAPSULE | Freq: Every day | ORAL | 3 refills | Status: DC

## 2017-02-20 MED ORDER — METHOCARBAMOL 500 MG PO TABS: 500.0000 mg | ORAL_TABLET | Freq: Three times a day (TID) | ORAL | 0 refills | Status: DC | PRN

## 2017-02-20 MED ORDER — METHYLPREDNISOLONE ACETATE 80 MG/ML IJ SUSP
80.0000 mg | Freq: Once | INTRAMUSCULAR | Status: AC
  Administered 2017-02-20: 80 mg via INTRAMUSCULAR

## 2017-02-20 MED ORDER — PREDNISONE 50 MG PO TABS: 50.0000 mg | ORAL_TABLET | Freq: Every day | ORAL | 0 refills | Status: DC

## 2017-02-20 MED ORDER — VITAMIN D (ERGOCALCIFEROL) 1.25 MG (50000 UNIT) PO CAPS: 50000.0000 [IU] | ORAL_CAPSULE | ORAL | 0 refills | Status: DC

## 2017-02-20 NOTE — Progress Notes (Signed)
[name] [address] [place], [place] [address] Phone: [phone] Subjective:     CC: Low back pain  [id number]  Derrick [name] is a 36 y.o. male coming in with complaint of low back pain. 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 Be left-sided. Mild radiation down the left leg was movement. Rates the severity pain is 8 out of 10. States that this feels worse than any muscle spasm is had previously. Denies any bowel or bladder incontinence, rates the severity of pain as stated again 8 out of 10. Patient states that even standing with his foot completely down cause significant pain. Denies though any weakness. Possible numbness but intermittent  Patient did have x-rays that were independently visualized by me. 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 Otherwise fairly unremarkable.     Past Medical History:  Diagnosis Date  . Chest pain    a. 06/16/15: normal stress echo  . GERD (gastroesophageal reflux disease)   . H/O fracture of nose   . MRSA infection    on legs,   . Tobacco abuse    Past Surgical History:  Procedure Laterality Date  . ESOPHAGOGASTRODUODENOSCOPY N/A 04/15/2015   Procedure: ESOPHAGOGASTRODUODENOSCOPY (EGD);  Surgeon: Malissa Hippo, MD;  Location: [facility];  Service: Endoscopy;  Laterality: N/A;  1055 - moved to 12:10 - Ann notified pt  . right ankle fracture     X 3  . testicular tortion    . UMBILICAL HERNIA REPAIR     Social History   Social History  . Marital status: Married    Spouse name: N/A  . Number of children: N/A  . Years of education: N/A   Social History Main Topics  . Smoking status: Current Every Day Smoker    Packs/day: 0.50    Years: 20.00    Types: Cigarettes  . Smokeless tobacco: Never Used  . Alcohol use Yes     Comment: socially  . Drug use: No  . Sexual activity: Not Asked   Other Topics Concern  . None   Social  History Narrative  . None   Allergies  Allergen Reactions  . Bee Venom Swelling   No family history on file.   Past medical history, social, surgical and family history all reviewed in electronic medical record.  No pertanent information unless stated regarding to the chief complaint.   Review of Systems:Review of systems updated and as accurate as of 02/20/17  No headache, visual changes, nausea, vomiting, diarrhea, constipation, dizziness, abdominal pain, skin rash, fevers, chills, night sweats, weight loss, swollen lymph nodes, body aches, joint swelling, muscle aches, chest pain, shortness of breath, mood changes.   Objective  Blood pressure 138/86, height  (1.727 m). Systems examined below as of 02/20/17   General: No apparent distress alert and oriented x3 mood and affect normal, dressed appropriately.  HEENT: Pupils equal, extraocular movements intact  Respiratory: Patient's speak in full sentences and does not appear short of breath  Cardiovascular: No lower extremity edema, non tender, no erythema  Skin: Warm dry intact with no signs of infection or rash on extremities or on axial skeleton.  Abdomen: Soft nontender  Neuro: Cranial nerves II through XII are intact, neurovascularly intact in all extremities with 2+ DTRs and 2+ pulses.  Lymph: No lymphadenopathy of posterior or anterior cervical chain or axillae bilaterally.  Gait normal with good balance  and coordination.  MSK:  Non tender with full range of motion and good stability and symmetric strength and tone of shoulders, elbows, wrist, hip, knee and ankles bilaterally.  Back Exam:  Inspection: Loss of lordosis Motion: Flexion 45 deg, Extension 25 deg, Side Bending to 35 deg bilaterally,  Rotation to 45 deg bilaterally  SLR laying: Positive left-sided 25 of flexion XSLR laying: Negative  Palpable tenderness: Tender to palpation  musculature. Lumbar spine. FABER: negative. Sensory change: Gross sensation intact  to all lumbar and sacral dermatomes.  Reflexes: 2+ at both patellar tendons, 2+ at achilles tendons, Babinski's downgoing.  Strength at foot  Plantar-flexion: 5/5 Dorsi-flexion: 5/5 Eversion: 5/5 Inversion: 5/5  Leg strength  Quad: 5/5 Hamstring: 5/5 Hip flexor: 5/5 Hip abductors: 4/5 on left compared to the right Gait unremarkable.    Impression and Recommendations:     This case required medical decision making of moderate complexity.      Note: This dictation was prepared with Dragon dictation along with smaller phrase technology. Any transcriptional errors that result from this process are unintentional.

## 2017-02-20 NOTE — Assessment & Plan Note (Signed)
Patient is a new symptoms consistent with more of a lumbar radiculopathy. 4 flexion is positive. Patient's x-rays were fairly unremarkable except for the mild congenital abnormality noted. Patient given muscle relaxers, prednisone, once weekly vitamin D in case there is any type of ligamentous injury to help aid in healing. Gabapentin did take at night. 2 injections given today for pain relief. Worsening symptoms advance imaging would be warranted. Patient given list of red flags and when to seek medical attention. Follow-up again with me in 1 week for further evaluation and treatment. Patient will be out of work until further evaluation.

## 2017-02-20 NOTE — Patient Instructions (Addendum)
Nice to meet you  I think you ruptured a ligamnet in your back. 2 injections today  Gabapentin  ta night Robaxin if you really need it up to 3 times a day  Prednisone daily starting tomorrow.  Vitamin D once a week. For 8 weeks Ice 20 minutes 2 times daily. Usually after activity and before bed. See me again on Monday and we will see what we can allow you to do.

## 2017-02-22 ENCOUNTER — Other Ambulatory Visit: Payer: Self-pay | Admitting: *Deleted

## 2017-02-22 MED ORDER — TRAMADOL HCL 50 MG PO TABS: 50.0000 mg | ORAL_TABLET | Freq: Three times a day (TID) | ORAL | 0 refills | Status: DC | PRN

## 2017-02-22 NOTE — Telephone Encounter (Signed)
Pt called stating he was still in a lot of pain. Per dr Katrinka Blazing okay to send in rx for tramadol  q8h prn. Pt made aware & If pain gets worse over the weekend then he will need to go to the ER. Pt understood.

## 2017-02-25 ENCOUNTER — Ambulatory Visit (INDEPENDENT_AMBULATORY_CARE_PROVIDER_SITE_OTHER): Payer: Worker's Compensation | Admitting: Family Medicine

## 2017-02-25 ENCOUNTER — Encounter: Payer: Self-pay | Admitting: Family Medicine

## 2017-02-25 ENCOUNTER — Encounter: Payer: Self-pay | Admitting: *Deleted

## 2017-02-25 DIAGNOSIS — M5416 Radiculopathy, lumbar region: Secondary | ICD-10-CM | POA: Diagnosis not present

## 2017-02-25 MED ORDER — MELOXICAM 15 MG PO TABS: 15.0000 mg | ORAL_TABLET | Freq: Every day | ORAL | 0 refills | Status: DC

## 2017-02-25 NOTE — Assessment & Plan Note (Signed)
Patient is making some progress. Started on meloxicam. We discussed using tramadol as needed. We discussed the possibility of advanced imaging but with patient making some mild improvement we will continue to monitor. Patient will return to work with seated work only for the next week and then further evaluate again.

## 2017-02-25 NOTE — Patient Instructions (Addendum)
Good to see you  Gustavus Bryant is your friend.  Meloxicam daily for 10 days then as needed COntinue the gabapentin  Seated work only  See you next week.

## 2017-02-25 NOTE — Progress Notes (Signed)
[name] [address] [place], [place] [address] Phone: [phone] Subjective:    I'm seeing this patient by the request  of:    CC: Low back pain follow-up  [id number]  DMARCUS DECICCO is a 36 y.o. male coming in with complaint of low back pain. 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 Patient still has pain in the generalized area. Seems to go bilaterally. Patient still states it is intermittently going down the left leg.   X-rays were taken 02/20/2017 that were independently visualized by me. Showed no significant bony abnormality.       Past Medical History:  Diagnosis Date  . Chest pain    a. 06/16/15: normal stress echo  . GERD (gastroesophageal reflux disease)   . H/O fracture of nose   . MRSA infection    on legs,   . Tobacco abuse    Past Surgical History:  Procedure Laterality Date  . ESOPHAGOGASTRODUODENOSCOPY N/A 04/15/2015   Procedure: ESOPHAGOGASTRODUODENOSCOPY (EGD);  Surgeon: Malissa Hippo, MD;  Location: [facility];  Service: Endoscopy;  Laterality: N/A;  1055 - moved to 12:10 - Ann notified pt  . right ankle fracture     X 3  . testicular tortion    . UMBILICAL HERNIA REPAIR     Social History   Social History  . Marital status: Married    Spouse name: N/A  . Number of children: N/A  . Years of education: N/A   Social History Main Topics  . Smoking status: Current Every Day Smoker    Packs/day: 0.50    Years: 20.00    Types: Cigarettes  . Smokeless tobacco: Never Used  . Alcohol use Yes     Comment: socially  . Drug use: No  . Sexual activity: Not Asked   Other Topics Concern  . None   Social History Narrative  . None   Allergies  Allergen Reactions  . Bee Venom Swelling   No family history on file. No known  family history   Past medical history, social, surgical and family history all reviewed in electronic medical record.  No pertanent information unless stated regarding to the chief complaint.   Review of Systems:Review of systems updated and as accurate as of 02/25/17  No headache, visual changes, nausea, vomiting, diarrhea, constipation, dizziness, abdominal pain, skin rash, fevers, chills, night sweats, weight loss, swollen lymph nodes, body aches, joint swelling, chest pain, shortness of breath, mood changes. Positive muscle aches  Objective  Blood pressure 130/80, pulse 67, height  (1.727 m), weight 206 lb (93.4 kg), SpO2 93 %. Systems examined below as of 02/25/17   General: No apparent distress alert and oriented x3 mood and affect normal, dressed appropriately.  HEENT: Pupils equal, extraocular movements intact  Respiratory: Patient's speak in full sentences and does not appear short of breath  Cardiovascular: No lower extremity edema, non tender, no erythema  Skin: Warm dry intact with no signs of infection or rash on extremities or on axial skeleton.  Abdomen: Soft nontender  Neuro: Cranial nerves II through XII are intact, neurovascularly intact in all extremities with 2+ DTRs and 2+ pulses.  Lymph: No lymphadenopathy of posterior or anterior cervical chain or axillae bilaterally.  Gait normal with good balance and coordination.  MSK:  Non tender with full range of motion and good stability and symmetric strength and tone of shoulders, elbows, wrist, hip, knee and ankles bilaterally.  Back Exam:  Inspection: Loss of lordosis Motion: Flexion 25 deg with worsening symptoms, Extension 25 deg, Side Bending to 45 deg bilaterally,  Rotation to 45 deg bilaterally  SLR laying: Positive left XSLR laying: Negative  Palpable tenderness: Tender to palpation in the paraspinal musculature lumbar spine record and left. FABER: Still having some mild tightness on the left. Sensory change:  Gross sensation intact to all lumbar and sacral dermatomes.  Reflexes: 2+ at both patellar tendons, 2+ at achilles tendons, Babinski's downgoing.  Strength at foot  Plantar-flexion: 5/5 Dorsi-flexion: 5/5 Eversion: 5/5 Inversion: 5/5  Leg strength  Quad: 5/5 Hamstring: 5/5 Hip flexor: 5/5 Hip abductors: 4/5  Gait unremarkable.     Impression and Recommendations:     This case required medical decision making of moderate complexity.      Note: This dictation was prepared with Dragon dictation along with smaller phrase technology. Any transcriptional errors that result from this process are unintentional.

## 2017-03-02 NOTE — Progress Notes (Signed)
[name] [address] [place], [place] [address] Phone: [phone] Subjective:    I'm seeing this patient by the request  of:    CC: Back pain follow-up  QIO:[id number]  Derrick [name] is a 36 y.o. male coming in with complaint of low back pain. Found have more of a left lumbar radiculopathy. Seem to be improving slowly. Still had tramadol for breakthrough pain. Patient states  He is started to worsen again. Patient states that the pain is worse. Patient is also having increasing radicular symptoms down the left leg. Waking him up at night. His pain medications are not helping.      Past Medical History:  Diagnosis Date  . Chest pain    a. 06/16/15: normal stress echo  . GERD (gastroesophageal reflux disease)   . H/O fracture of nose   . MRSA infection    on legs,   . Tobacco abuse    Past Surgical History:  Procedure Laterality Date  . ESOPHAGOGASTRODUODENOSCOPY N/A 04/15/2015   Procedure: ESOPHAGOGASTRODUODENOSCOPY (EGD);  Surgeon: Malissa Hippo, MD;  Location: [facility];  Service: Endoscopy;  Laterality: N/A;  1055 - moved to 12:10 - Ann notified pt  . right ankle fracture     X 3  . testicular tortion    . UMBILICAL HERNIA REPAIR     Social History   Social History  . Marital status: Married    Spouse name: N/A  . Number of children: N/A  . Years of education: N/A   Social History Main Topics  . Smoking status: Current Every Day Smoker    Packs/day: 0.50    Years: 20.00    Types: Cigarettes  . Smokeless tobacco: Never Used  . Alcohol use Yes     Comment: socially  . Drug use: No  . Sexual activity: Not on file   Other Topics Concern  . Not on file   Social History Narrative  . No narrative on file   Allergies  Allergen Reactions  . Bee Venom Swelling   No family history on file.   Past medical history, social, surgical and family history all reviewed in electronic medical record.  No pertanent information  unless stated regarding to the chief complaint.   Review of Systems:Review of systems updated and as accurate as of 03/02/17  No headache, visual changes, nausea, vomiting, diarrhea, constipation, dizziness, abdominal pain, skin rash, fevers, chills, night sweats, weight loss, swollen lymph nodes, body aches, joint swelling, chest pain, shortness of breath, mood changes. Positive muscle aches  Objective  There were no vitals taken for this visit. Systems examined below as of 03/02/17   General: No apparent distress alert and oriented x3 mood and affect normal, dressed appropriately.  HEENT: Pupils equal, extraocular movements intact  Respiratory: Patient's speak in full sentences and does not appear short of breath  Cardiovascular: No lower extremity edema, non tender, no erythema  Skin: Warm dry intact with no signs of infection or rash on extremities or on axial skeleton.  Abdomen: Soft nontender  Neuro: Cranial nerves II through XII are intact, neurovascularly intact in all extremities with 2+ DTRs and 2+ pulses.  Lymph: No lymphadenopathy of posterior or anterior cervical chain or axillae bilaterally.  Gait Antalgic gait MSK:  Non tender with full range of motion and good stability and symmetric strength and tone of shoulders, elbows, wrist, hip, knee and ankles bilaterally.  Back Exam:  Inspection: Patient does have some loss  of lordosis fullness noted on the left lower back Motion: Flexion 25 deg worsening radicular symptoms on the left leg, Extension 20 deg, Side Bending to 35 deg bilaterally,  Rotation to 45 deg bilaterally  SLR laying: Negative  XSLR laying: Negative  Palpable tenderness: Severely tender to palpation over the left sacroiliac joint and the left paraspinal musculature. FABER: Tightness on the left. Sensory change: Gross sensation intact to all lumbar and sacral dermatomes.  Reflexes: 2+ at both patellar tendons, 2+ at achilles tendons, Babinski's downgoing.    Strength at foot  Mild weakness with dorsiflexion and plantarflexion on the left leg.    Impression and Recommendations:     This case required medical decision making of moderate complexity.      Note: This dictation was prepared with Dragon dictation along with smaller phrase technology. Any transcriptional errors that result from this process are unintentional.

## 2017-03-04 ENCOUNTER — Encounter: Payer: Self-pay | Admitting: Family Medicine

## 2017-03-04 ENCOUNTER — Encounter: Payer: Self-pay | Admitting: *Deleted

## 2017-03-04 ENCOUNTER — Ambulatory Visit (INDEPENDENT_AMBULATORY_CARE_PROVIDER_SITE_OTHER): Payer: Self-pay | Admitting: Family Medicine

## 2017-03-04 VITALS — BP 160/70 | HR 73 | Ht 68.0 in | Wt 203.0 lb

## 2017-03-04 DIAGNOSIS — M5416 Radiculopathy, lumbar region: Secondary | ICD-10-CM

## 2017-03-04 MED ORDER — HYDROCODONE-ACETAMINOPHEN 10-325 MG PO TABS: 1.0000 | ORAL_TABLET | Freq: Four times a day (QID) | ORAL | 0 refills | Status: DC | PRN

## 2017-03-04 NOTE — Patient Instructions (Signed)
Good to see you  Derrick [name] is your friend.  We will get MRI  [place] up to 3 times a day  Out of work until we know.  We will discuss with you when we get MRI on the next step

## 2017-03-04 NOTE — Assessment & Plan Note (Signed)
Patient is having worsening symptoms or worsening lumbar radiculopathy with the pain medications. Also found to have more of a weakness today. Deep tendon reflexes are still intact. Patient was having even difficult he walking. Increase patient's pain medication and her: We'll get advance imaging including an MRI for further evaluation. Patient could be a candidate for epidural injections. Patient will follow-up with me after the advance imaging and we'll discuss further management.

## 2017-03-09 ENCOUNTER — Ambulatory Visit (HOSPITAL_BASED_OUTPATIENT_CLINIC_OR_DEPARTMENT_OTHER)
Admission: RE | Admit: 2017-03-09 | Discharge: 2017-03-09 | Disposition: A | Discharge status: Home / Self Care | Payer: Worker's Compensation | Source: Ambulatory Visit | Attending: Family Medicine | Admitting: Family Medicine

## 2017-03-09 DIAGNOSIS — M5116 Intervertebral disc disorders with radiculopathy, lumbar region: Secondary | ICD-10-CM | POA: Diagnosis not present

## 2017-03-09 DIAGNOSIS — M5416 Radiculopathy, lumbar region: Secondary | ICD-10-CM

## 2017-03-09 DIAGNOSIS — M5137 Other intervertebral disc degeneration, lumbosacral region: Secondary | ICD-10-CM | POA: Insufficient documentation

## 2017-03-11 ENCOUNTER — Other Ambulatory Visit: Payer: Self-pay | Admitting: *Deleted

## 2017-03-11 DIAGNOSIS — M5416 Radiculopathy, lumbar region: Secondary | ICD-10-CM

## 2017-03-13 ENCOUNTER — Telehealth: Payer: Self-pay | Admitting: Family Medicine

## 2017-03-13 NOTE — Telephone Encounter (Signed)
[employer] company: Trying to get last OV notes from 10/8 along with MRI report.  Fax [phone].  Has not met with patient to get medical records release.  States patient should have text Ria Comment in regard.

## 2017-03-13 NOTE — Telephone Encounter (Signed)
Verified with pt to send records to Workers comp.

## 2017-03-14 NOTE — Telephone Encounter (Signed)
Has called back.  States has not received records.  States fax number listed is correct fax number

## 2017-03-15 NOTE — Telephone Encounter (Signed)
Resend records.

## 2017-03-19 ENCOUNTER — Ambulatory Visit
Admission: RE | Admit: 2017-03-19 | Discharge: 2017-03-19 | Disposition: A | Discharge status: Home / Self Care | Payer: Worker's Compensation | Source: Ambulatory Visit | Attending: Family Medicine | Admitting: Family Medicine

## 2017-03-19 DIAGNOSIS — M5416 Radiculopathy, lumbar region: Secondary | ICD-10-CM

## 2017-03-19 MED ORDER — IOPAMIDOL (ISOVUE-M 200) INJECTION 41%
1.0000 mL | Freq: Once | INTRAMUSCULAR | Status: AC
  Administered 2017-03-19: 1 mL via EPIDURAL

## 2017-03-19 MED ORDER — METHYLPREDNISOLONE ACETATE 40 MG/ML INJ SUSP (RADIOLOG
120.0000 mg | Freq: Once | INTRAMUSCULAR | Status: AC
  Administered 2017-03-19: 120 mg via EPIDURAL

## 2017-03-19 NOTE — Discharge Instructions (Signed)

## 2017-03-21 ENCOUNTER — Other Ambulatory Visit: Payer: Self-pay

## 2017-05-27 ENCOUNTER — Encounter (INDEPENDENT_AMBULATORY_CARE_PROVIDER_SITE_OTHER): Payer: Self-pay | Admitting: Internal Medicine

## 2017-06-18 ENCOUNTER — Ambulatory Visit (INDEPENDENT_AMBULATORY_CARE_PROVIDER_SITE_OTHER): Payer: 59 | Admitting: Internal Medicine

## 2017-06-18 ENCOUNTER — Encounter (INDEPENDENT_AMBULATORY_CARE_PROVIDER_SITE_OTHER): Payer: Self-pay | Admitting: Internal Medicine

## 2017-06-18 VITALS — BP 160/100 | HR 56 | Temp 98.1°F | Ht 68.0 in | Wt 204.9 lb

## 2017-06-18 DIAGNOSIS — K219 Gastro-esophageal reflux disease without esophagitis: Secondary | ICD-10-CM

## 2017-06-18 NOTE — Progress Notes (Addendum)
   Subjective:    Patient ID: Derrick SmallingJohn P Vandivier, male    DOB: 1980/12/21, 37 y.o.   MRN: [id number]  HPI Here today for f/u. Last seen in January of 2018 for GERD.   He tells me he is doing good. Every now and then, he has a GERD flare. He is avoiding spicy foods.   04/15/2015 EGD  Indications: Patient is 37 old Caucasian male who presents with few year history of postprandial bloating discomfort regurgitation and heartburn no more than twice a week. He admits to eating too much at some of his meals. Recent ultrasound was negative for cholelithiasis. Lab data pertinent for thrombocytopenia which is chronic but his LFTs were normal. Impression: Erosive reflux esophagitis. Gastroduodenitis.  Review of Systems Past Medical History:  Diagnosis Date  . Chest pain    a. 06/16/15: normal stress echo  . GERD (gastroesophageal reflux disease)   . H/O fracture of nose   . MRSA infection    on legs,   . Tobacco abuse     Past Surgical History:  Procedure Laterality Date  . ESOPHAGOGASTRODUODENOSCOPY N/A 04/15/2015   Procedure: ESOPHAGOGASTRODUODENOSCOPY (EGD);  Surgeon: Malissa HippoNajeeb U Rehman, MD;  Location: [facility];  Service: Endoscopy;  Laterality: N/A;  1055 - moved to 12:10 - Ann notified pt  . right ankle fracture     X 3  . testicular tortion    . UMBILICAL HERNIA REPAIR      Allergies  Allergen Reactions  . Bee Venom Swelling    Current Outpatient Medications on File Prior to Visit  Medication Sig Dispense Refill  . calcium carbonate (TUMS - DOSED IN MG ELEMENTAL CALCIUM) 500 MG chewable tablet Chew 1 tablet by mouth daily.    . diazepam (VALIUM) 2 MG tablet Take 2 mg by mouth as needed for anxiety.    . Ibuprofen (ADVIL) 200 MG CAPS Take 200 mg by mouth 2 (two) times daily as needed (pain).    . methocarbamol (ROBAXIN) 500 MG tablet Take 1 tablet (500 mg total) by mouth  every 8 (eight) hours as needed for muscle spasms. 30 tablet 0  . omeprazole (PRILOSEC) 40 MG capsule Take 1 capsule (40 mg total) by mouth 2 (two) times daily. 60 capsule 11  . ranitidine (ZANTAC) 150 MG tablet Take 150 mg by mouth at bedtime.     No current facility-administered medications on file prior to visit.         Objective:   Physical Exam Blood pressure (!) 160/100, pulse (!) 56, temperature 98.1 F (36.7 C), height 5\' 8"  (1.727 m), weight 204 lb 14.4 oz (92.9 kg). Alert and oriented. Skin warm and dry. Oral mucosa is moist.   . Sclera anicteric, conjunctivae is pink. Thyroid not enlarged. No cervical lymphadenopathy. Lungs clear. Heart regular rate and rhythm.  Abdomen is soft. Bowel sounds are positive. No hepatomegaly. No abdominal masses felt. No tenderness.  No edema to lower extremities. P          Assessment & Plan:  GERD. Continue the Omeprazole daily OV in 1 year.

## 2017-06-18 NOTE — Patient Instructions (Addendum)
Continue the Omeprazole. OV in 1 year. Gastroesophageal Reflux Disease, Adult Normally, food travels down the esophagus and stays in the stomach to be digested. If a person has gastroesophageal reflux disease (GERD), food and stomach acid move back up into the esophagus. When this happens, the esophagus becomes sore and swollen (inflamed). Over time, GERD can make small holes (ulcers) in the lining of the esophagus. Follow these instructions at home: Diet  Follow a diet as told by your doctor. You may need to avoid foods and drinks such as: ? Coffee and tea (with or without caffeine). ? Drinks that contain alcohol. ? Energy drinks and sports drinks. ? Carbonated drinks or sodas. ? Chocolate and cocoa. ? Peppermint and mint flavorings. ? Garlic and onions. ? Horseradish. ? Spicy and acidic foods, such as peppers, chili powder, curry powder, vinegar, hot sauces, and BBQ sauce. ? Citrus fruit juices and citrus fruits, such as oranges, lemons, and limes. ? Tomato-based foods, such as red sauce, chili, salsa, and pizza with red sauce. ? Fried and fatty foods, such as donuts, french fries, potato chips, and high-fat dressings. ? High-fat meats, such as hot dogs, rib eye steak, sausage, ham, and bacon. ? High-fat dairy items, such as whole milk, butter, and cream cheese.  Eat small meals often. Avoid eating large meals.  Avoid drinking large amounts of liquid with your meals.  Avoid eating meals during the 2-3 hours before bedtime.  Avoid lying down right after you eat.  Do not exercise right after you eat. General instructions  Pay attention to any changes in your symptoms.  Take over-the-counter and prescription medicines only as told by your doctor. Do not take aspirin, ibuprofen, or other NSAIDs unless your doctor says it is okay.  Do not use any tobacco products, including cigarettes, chewing tobacco, and e-cigarettes. If you need help quitting, ask your doctor.  Wear loose  clothes. Do not wear anything tight around your waist.  Raise (elevate) the head of your bed about 6 inches (15 cm).  Try to lower your stress. If you need help doing this, ask your doctor.  If you are overweight, lose an amount of weight that is healthy for you. Ask your doctor about a safe weight loss goal.  Keep all follow-up visits as told by your doctor. This is important. Contact a doctor if:  You have new symptoms.  You lose weight and you do not know why it is happening.  You have trouble swallowing, or it hurts to swallow.  You have wheezing or a cough that keeps happening.  Your symptoms do not get better with treatment.  You have a hoarse voice. Get help right away if:  You have pain in your arms, neck, jaw, teeth, or back.  You feel sweaty, dizzy, or light-headed.  You have chest pain or shortness of breath.  You throw up (vomit) and your throw up looks like blood or coffee grounds.  You pass out (faint).  Your poop (stool) is bloody or black.  You cannot swallow, drink, or eat. This information is not intended to replace advice given to you by your health care provider. Make sure you discuss any questions you have with your health care provider. Document Released: 10/31/2007 Document Revised: 10/20/2015 Document Reviewed: 09/08/2014 Elsevier Interactive Patient Education  2018 [employer]  Food Choices for Gastroesophageal Reflux Disease, Adult When you have gastroesophageal reflux disease (GERD), the foods you eat and your eating habits are very important. Choosing the right foods  can help ease the discomfort of GERD. Consider working with a diet and nutrition specialist (dietitian) to help you make healthy food choices. What general guidelines should I follow? Eating plan  Choose healthy foods low in fat, such as fruits, vegetables, whole grains, low-fat dairy products, and lean meat, fish, and poultry.  Eat frequent, small meals instead of three  large meals each day. Eat your meals slowly, in a relaxed setting. Avoid bending over or lying down until 2-3 hours after eating.  Limit high-fat foods such as fatty meats or fried foods.  Limit your intake of oils, butter, and shortening to less than 8 teaspoons each day.  Avoid the following: ? Foods that cause symptoms. These may be different for different people. Keep a food diary to keep track of foods that cause symptoms. ? Alcohol. ? Drinking large amounts of liquid with meals. ? Eating meals during the 2-3 hours before bed.  Cook foods using methods other than frying. This may include baking, grilling, or broiling. Lifestyle   Maintain a healthy weight. Ask your health care provider what weight is healthy for you. If you need to lose weight, work with your health care provider to do so safely.  Exercise for at least 30 minutes on 5 or more days each week, or as told by your health care provider.  Avoid wearing clothes that fit tightly around your waist and chest.  Do not use any products that contain nicotine or tobacco, such as cigarettes and e-cigarettes. If you need help quitting, ask your health care provider.  Sleep with the head of your bed raised. Use a wedge under the mattress or blocks under the bed frame to raise the head of the bed. What foods are not recommended? The items listed may not be a complete list. Talk with your dietitian about what dietary choices are best for you. Grains Pastries or quick breads with added fat. [place] toast. Vegetables Deep fried vegetables. [place] fries. Any vegetables prepared with added fat. Any vegetables that cause symptoms. For some people this may include tomatoes and tomato products, chili peppers, onions and garlic, and horseradish. Fruits Any fruits prepared with added fat. Any fruits that cause symptoms. For some people this may include citrus fruits, such as oranges, grapefruit, pineapple, and lemons. Meats and other  protein foods High-fat meats, such as fatty beef or pork, hot dogs, ribs, ham, sausage, salami and bacon. Fried meat or protein, including fried fish and fried chicken. Nuts and nut butters. Dairy Whole milk and chocolate milk. Sour cream. Cream. Ice cream. Cream cheese. Milk shakes. Beverages Coffee and tea, with or without caffeine. Carbonated beverages. Sodas. Energy drinks. Fruit juice made with acidic fruits (such as orange or grapefruit). Tomato juice. Alcoholic drinks. Fats and oils Butter. Margarine. Shortening. Ghee. Sweets and desserts Chocolate and cocoa. Donuts. Seasoning and other foods Pepper. Peppermint and spearmint. Any condiments, herbs, or seasonings that cause symptoms. For some people, this may include curry, hot sauce, or vinegar-based salad dressings. Summary  When you have gastroesophageal reflux disease (GERD), food and lifestyle choices are very important to help ease the discomfort of GERD.  Eat frequent, small meals instead of three large meals each day. Eat your meals slowly, in a relaxed setting. Avoid bending over or lying down until 2-3 hours after eating.  Limit high-fat foods such as fatty meat or fried foods. This information is not intended to replace advice given to you by your health care provider. Make sure you discuss  any questions you have with your health care provider. Document Released: 05/14/2005 Document Revised: 05/15/2016 Document Reviewed: 05/15/2016 Elsevier Interactive Patient Education  [employer].

## 2017-08-06 ENCOUNTER — Other Ambulatory Visit (INDEPENDENT_AMBULATORY_CARE_PROVIDER_SITE_OTHER): Payer: Self-pay | Admitting: Internal Medicine

## 2017-08-06 DIAGNOSIS — K2100 Gastro-esophageal reflux disease with esophagitis, without bleeding: Secondary | ICD-10-CM

## 2018-01-15 ENCOUNTER — Ambulatory Visit: Payer: 59 | Admitting: Allergy & Immunology

## 2018-01-15 ENCOUNTER — Encounter: Payer: Self-pay | Admitting: Allergy & Immunology

## 2018-01-15 VITALS — BP 142/90 | HR 66 | Temp 97.4°F | Resp 18 | Ht 67.5 in | Wt 201.0 lb

## 2018-01-15 DIAGNOSIS — T781XXD Other adverse food reactions, not elsewhere classified, subsequent encounter: Secondary | ICD-10-CM | POA: Diagnosis not present

## 2018-01-15 MED ORDER — TRIAMCINOLONE ACETONIDE 0.1 % EX OINT: 1.0000 "application " | TOPICAL_OINTMENT | Freq: Two times a day (BID) | CUTANEOUS | 1 refills | Status: DC

## 2018-01-15 MED ORDER — EPINEPHRINE 0.3 MG/0.3ML IJ SOAJ: 0.3000 mg | Freq: Once | INTRAMUSCULAR | 2 refills | Status: AC

## 2018-01-15 NOTE — Progress Notes (Addendum)
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  His history is certainly consistent with alpha gal sensitization, as it typically has a waxing waning course and is difficult to predict when and how bad each reaction will be.  It seems that he has developed a method of dealing with his symptoms, and even tries to premedicate with histamine blockade prior to ingestion of red meat.  We did discuss how the reactions are difficult to predict, and ideally he should avoid all red meats to avoid the episodes of anaphylaxis.  We did provide him with an Auvi-Q prescription and instructed him on how to use this.  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  We did discuss the indications for using epinephrine.  He seemed to be more focused on vital sign changes, but we did discuss how the involvement of more than one organ system does justify the use of epinephrine.  He has an avid outdoorsman, and is likely to get additional tick bites in the future.  However, we can test his titers over time and determine whether we could do a challenge in the office setting to rule out alpha gal sensitivity.  He denies tick bites, but often times patients do not recall being bitten by a tick.  He does get chigger bites very often.  There is some data coming out that other insects can act as vectors for alpha gal sensitization, but the vast majority are still secondary to tick bites.  I am not sure that chiggers have ever been implicated in being a factor for alpha gal sensitization.   Plan/Recommendations:   1. 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  - I would avoid all red meats for now.   - The course can be intermittent, so your presentation is fairly classic. - We will retest your levels in 1 year to see where it is headed.  - We will send in a prescription for AuviQ (epinephrine). - They should call you in 1-2 days to confirm your shipping address.  - I do not think that you need more food testing at this point.  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   2. Chigger bites - Add on triamcinolone ointment twice daily as needed for itching.  - Continue with repellant.  3. Return in about 1 year (around 01/16/2019).   Subjective:   Derrick [name] is a 37 y.o. male presenting today for evaluation of  Chief Complaint  Patient presents with  . Allergic Reaction    Food    Derrick [name] has a history of the following: Patient Active Problem List   Diagnosis Date Noted  . Left lumbar radiculopathy 02/20/2017  . GERD (gastroesophageal reflux disease)   . Tobacco abuse   . Chest pain 06/15/2015  . IBS (irritable bowel syndrome) 04/05/2015  . Thrombocytopenia (HCC) 04/05/2015    History obtained from: chart review and patient.  Derrick [name] was referred by Assunta FoundGolding, Jamisen, MD.     Derrick RuizJohn is a 37 y.o.  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 He did take two Zantac last night.  He has been having reactions sporadically. He ate Sonic one night and then around 2am he started having symptoms including diarrhea and hives. He would treat with Zantac and Benadryl. Symptoms resolve. Then the following day, he went to a cookout and ate two cheeseburgers and had the same symptoms overnight. The following week, he had [employer] with the same thing. He does eat red meat more days than not, and does not have a reaction on every occasion. He did eat BBQ last night for work. Symptoms have been ongoing for nearly five years.   He went to see his  PCP, who referred him to an allergist. He had an alpha gal panel ordered that was positive at 0.9, per the patient. We are getting the results from his PCP. He does not remember any kind of tick bite at all. Symptoms have become more sporadic. Part of the workup for his reactions included RMSF and Lyme, which was negative. He was positive for RMSF and was treated with doxycyline for 10 days.   He does report that milk and ice cream bother him, but he can tolerate cheese. He typically has GI distress from the milk and ice cream. Sour cream sometimes does cause symptoms and sometimes it does not.  He otherwise tolerates all of the major food allergens without adverse event.  He does have a history of thrombocytopenia. It has been around 80K for 5-6 years. He has never had bleeding problems and has never needed platelet transfusions.  He also has high blood pressure and is a smoker.  Otherwise, there is no history of other atopic diseases, including asthma, drug allergies, food allergies, environmental allergies, stinging insect allergies, or urticaria. There is no significant infectious history. Vaccinations are up to date.    Past Medical History: Patient Active Problem List   Diagnosis Date Noted  . Left lumbar radiculopathy 02/20/2017  . GERD (gastroesophageal reflux disease)   . Tobacco abuse   . Chest pain 06/15/2015  . IBS (irritable bowel syndrome) 04/05/2015  . Thrombocytopenia (HCC) 04/05/2015    Medication List:  Allergies as of 01/15/2018      Reactions   Bee Venom Swelling      Medication List        Accurate as of 01/15/18 10:42 AM. Always use your most recent med list.          ADVIL 200 MG Caps Generic drug:  Ibuprofen Take 200 mg by mouth 2 (two) times daily as needed (pain).   calcium carbonate 500 MG chewable tablet Commonly known as:  TUMS - dosed in mg elemental calcium Chew 1 tablet by mouth daily.   diazepam 2 MG tablet Commonly known as:  VALIUM Take 2  mg by mouth as needed for anxiety.   losartan-hydrochlorothiazide 100-12.5 MG tablet Commonly known as:  HYZAAR Take 1 tablet by mouth daily.   methocarbamol 500 MG tablet Commonly known as:  ROBAXIN Take 1 tablet (500 mg total) by mouth every 8 (eight) hours as needed for muscle spasms.   omeprazole 40 MG capsule Commonly known as:  PRILOSEC TAKE ONE CAPSULE BY MOUTH TWICE DAILY   ranitidine 150 MG tablet Commonly known as:  ZANTAC Take 150 mg by mouth at bedtime.       Birth History: non-contributory.  Developmental History: non-contributory.   Past Surgical History: Past Surgical History:  Procedure Laterality Date  . ESOPHAGOGASTRODUODENOSCOPY  N/A 04/15/2015   Procedure: ESOPHAGOGASTRODUODENOSCOPY (EGD);  Surgeon: Malissa Hippo, MD;  Location: [facility];  Service: Endoscopy;  Laterality: N/A;  1055 - moved to 12:10 - Ann notified pt  . right ankle fracture     X 3  . testicular tortion    . UMBILICAL HERNIA REPAIR       Family History: History reviewed. No pertinent family history.   Social History: Tao lives at home with his wife of 7 years.  They live in a house that is 39 years old.  There is laminate in the main living areas and carpeting in the bedrooms.  They have gas and electric heating with central cooling.  There are 2 dogs and 1 cat at home.  There are 3 dogs outside of the home.  There are no dust mite covers on the bedding.  There is tobacco exposure in the car, but not the home.  He has smoked since 1996 proximally 1 pack/day.  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  He especially likes hunting.   Review of Systems: a 14-point review of systems is pertinent for what is mentioned in HPI.  Otherwise, all other systems were negative. Constitutional: negative other than that listed in the HPI Eyes: negative other than that listed in the HPI Ears, nose, mouth, throat, and face: negative other than that listed in the  HPI Respiratory: negative other than that listed in the HPI Cardiovascular: negative other than that listed in the HPI Gastrointestinal: negative other than that listed in the HPI Genitourinary: negative other than that listed in the HPI Integument: negative other than that listed in the HPI Hematologic: negative other than that listed in the HPI Musculoskeletal: negative other than that listed in the HPI Neurological: negative other than that listed in the HPI Allergy/Immunologic: negative other than that listed in the HPI    Objective:   Blood pressure (!) 142/90, pulse 66, temperature (!) 97.4 F (36.3 C), temperature source Oral, resp. rate 18, height 5' 7.5" (1.715 m), weight 201 lb (91.2 kg), SpO2 97 %. Body mass index is 31.02 kg/m.   Physical Exam:  General: Alert, interactive, in no acute distress.  Pleasant male. Eyes: No conjunctival injection bilaterally, no discharge on the right, no discharge on the left and no Horner-Trantas dots present. PERRL bilaterally. EOMI without pain. No photophobia.  Ears: Right TM pearly gray with normal light reflex, Left TM pearly gray with normal light reflex, Right TM intact without perforation and Left TM intact without perforation.  Nose/Throat: External nose within normal limits and septum midline. Turbinates edematous and pale with clear discharge. Posterior oropharynx erythematous with cobblestoning in the posterior oropharynx. Tonsils 2+ without exudates.  Tongue without thrush. Neck: Supple without thyromegaly. Trachea midline. Adenopathy: no enlarged lymph nodes appreciated in the anterior cervical, occipital, axillary, epitrochlear, inguinal, or popliteal regions. Lungs: Clear to auscultation without wheezing, rhonchi or rales. No increased work of breathing. CV: Normal S1/S2. No murmurs. Capillary refill <2 seconds.  Abdomen: Nondistended, nontender. No guarding or rebound tenderness. Bowel sounds present in all fields and  hypoactive  Skin: Warm and dry, without lesions or rashes. Extremities:  No clubbing, cyanosis or edema. Neuro:   Grossly intact. No focal deficits appreciated. Responsive to questions.  Diagnostic studies: none (I did not feel that additional testing was indicated)       Malachi Bonds, MD [facility]

## 2018-01-15 NOTE — Patient Instructions (Addendum)
1. 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  - I would avoid all red meats for now.   - The course can be intermittent, so your presentation is fairly classic. - We will retest your levels in 1 year to see where it is headed.  - We will send in a prescription for AuviQ (epinephrine). - They should call you in 1-2 days to confirm your shipping address.  - I do not think that you need more food testing at this point.  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   2. Chigger bites - Add on triamcinolone ointment twice daily as needed for itching.  - Continue with repellant.  3. Return in about 1 year (around 01/16/2019).   Please inform us of any Emergency Department visits, hospitalizations, or changes in symptoms. Call us before going to the ED for breathing or allergy symptoms since we might be able to fit you in for a sick visit. Feel free to contact us anytime with any questions, problems, or concerns.  It was a pleasure to meet you today!  Websites that have reliable patient information: 1. American Academy of Asthma, Allergy, and Immunology: www.aaaai.org 2. Food Allergy Research and Education (FARE): foodallergy.org 3. Mothers of Asthmatics: http://www.asthmacommunitynetwork.org 4. American College of Allergy, Asthma, and Immunology: MissingWeapons.cawww.acaai.org   Make sure you are registered to vote! If you have moved or changed any of your contact information, you will need to get this updated before voting!         Alpha-gal and Red Meat Allergy   Overview An allergy to "alpha-gal" refers to having a severe and potentially life-threatening allergy to a carbohydrate molecule called galactose-alpha-1,3-galactose that is found in most mammalian or "red meat". Unlike other food allergies which typically occur within minutes of ingestion, symptoms from eating red meat such as pork, lamb or beef may be delayed, occurring 3-8 hours  after eating. Most food allergies are directed against a protein molecule, but alpha-gal is unusual because it is a carbohydrate, and a delay in its absorption may explain the delay in symptoms.  What are the symptoms of an alpha-gal allergy? As with other food allergies, signs or symptoms of an allergy to alpha-gal may include: . Hives and itching  . Swelling of your lips, face or eyelids  . Shortness of breath, cough or wheezing  . Abdominal pain, nausea, diarrhea or vomiting The most severe reaction, anaphylaxis, can present as a combination of several of these symptoms, may include low blood pressure, and is potentially fatal.  Because these symptoms are delayed, you may only wake up with them in the middle of the night after an evening meal.  How is an alpha-gal allergy diagnosed? Diagnosis of this allergy starts with your allergist taking an appropriate history and physical examination. Because the onset is usually quite delayed, it can be hard to associate the symptoms with eating red meat many hours previously. Triggers include any red meat - including beef, pork, lamb or even horse products. It may occur after eating hotdogs and hamburgers. In very rare cases the reaction may extend to milk or dairy proteins and gelatin.  Your allergist may recommend testing that includes skin tests to the relevant animal proteins and blood tests which measure the levels of a specific immunoglobulin E (IgE) antibody, to mammalian meats. An investigational blood test, IgE against alpha-gal itself, may also aid in the diagnosis.  How is an alpha-gal allergy treated?  Immediate symptoms such as hives or shortness of breath are treated the same as any other food allergy - in an urgent care setting with anti-histamines, epinephrine and other medications. Prevention long-term involves avoidance of all red meat in sensitized individuals. You may be advised to carry an epinephrine auto-injector, to be used in case  of subsequent accidental exposures and reaction. These measures do not necessarily mean switching to a full vegetarian diet, since poultry and fish can be consumed and do not cause similar reactions. As with other food allergies, there is the possibility that over time the sensitivity diminishes - although these changes may take many years to become apparent.  How do you become allergic to alpha-gal? Alpha-gal is a molecule carried in the saliva of the [place] tick and other potential arthropods typically after feeding on mammalian blood. People that are bitten by the tick, especially those that are bitten repeatedly, are at risk of becoming sensitized and producing the IgE necessary to then cause allergic reactions. Interestingly, allergic reactions may occur to red meat, to subsequent tick bites, and even to medications that contain alpha-gal. Cetuximab is a cancer medication that contains alpha-gal, and people who have had allergic reactions to this medication (these are typically immediate reactions, because it is infused intravenously) have a higher risk for red meat allergy and are likely to have been bitten by ticks in the past. As might be expected, the incidence of tick bites is much higher in the [place] the traditional habitat for the tick. However, cases are now increasingly reported in the [place]. And it is a phenomenon that has been observed worldwide, with different ticks responsible for similar cases of red meat allergy in many other countries such as [place], [place] and [place].  The discovery of this peculiar allergy has allowed researchers to correlate tick bites with many cases of anaphylaxis that would previously have been classified as 'idiopathic', or of unknown cause. Also, while it was originally thought that the [employer] tick had to feast on mammalian blood in order to carry the alpha-gal molecule, more recent research has shown that it  may carry this molecule and be capable of sensitizing humans independently.  How do you prevent an alpha-gal allergy? Because this allergy is predominantly tick born, you are more likely at risk if you often go outdoors in wooded areas for activities such as hiking, fishing or hunting. The key strategy is to prevent tick bites. This may include wearing long sleeved shirts or pants, using appropriate insect repellants, and surveying for ticks after spending time outdoors. Any observed ticks should be removed carefully by cleaning the site with rubbing alcohol, then using tweezers to pull the tick's head up carefully from the skin using steady pressure. Clean your hands and the site one more time and make sure not to crush the tick between your fingers.

## 2018-02-11 ENCOUNTER — Encounter: Payer: Self-pay | Admitting: Allergy & Immunology

## 2018-06-18 ENCOUNTER — Ambulatory Visit (INDEPENDENT_AMBULATORY_CARE_PROVIDER_SITE_OTHER): Payer: Self-pay | Admitting: Internal Medicine

## 2018-06-18 ENCOUNTER — Emergency Department (HOSPITAL_COMMUNITY): Payer: BLUE CROSS/BLUE SHIELD

## 2018-06-18 ENCOUNTER — Emergency Department (HOSPITAL_COMMUNITY)
Admission: EM | Admit: 2018-06-18 | Discharge: 2018-06-18 | Disposition: A | Discharge status: Home / Self Care | Payer: BLUE CROSS/BLUE SHIELD | Attending: Emergency Medicine | Admitting: Emergency Medicine

## 2018-06-18 ENCOUNTER — Encounter (HOSPITAL_COMMUNITY): Payer: Self-pay | Admitting: Emergency Medicine

## 2018-06-18 DIAGNOSIS — R0602 Shortness of breath: Secondary | ICD-10-CM | POA: Diagnosis not present

## 2018-06-18 DIAGNOSIS — R079 Chest pain, unspecified: Secondary | ICD-10-CM | POA: Insufficient documentation

## 2018-06-18 DIAGNOSIS — F17210 Nicotine dependence, cigarettes, uncomplicated: Secondary | ICD-10-CM | POA: Diagnosis not present

## 2018-06-18 DIAGNOSIS — Z79899 Other long term (current) drug therapy: Secondary | ICD-10-CM | POA: Diagnosis not present

## 2018-06-18 DIAGNOSIS — I10 Essential (primary) hypertension: Secondary | ICD-10-CM | POA: Diagnosis not present

## 2018-06-18 DIAGNOSIS — R0789 Other chest pain: Secondary | ICD-10-CM | POA: Diagnosis not present

## 2018-06-18 LAB — COMPREHENSIVE METABOLIC PANEL
ALK PHOS: 52 U/L (ref 38–126)
ALT: 24 U/L (ref 0–44)
AST: 31 U/L (ref 15–41)
Albumin: 4 g/dL (ref 3.5–5.0)
Anion gap: 9 (ref 5–15)
BUN: 25 mg/dL — AB (ref 6–20)
CHLORIDE: 108 mmol/L (ref 98–111)
CO2: 22 mmol/L (ref 22–32)
CREATININE: 1.29 mg/dL — AB (ref 0.61–1.24)
Calcium: 9 mg/dL (ref 8.9–10.3)
GFR calc Af Amer: >60 mL/min (ref >60)
Glucose, Bld: 96 mg/dL (ref 70–99)
Potassium: 3.6 mmol/L (ref 3.5–5.1)
Sodium: 139 mmol/L (ref 135–145)
Total Bilirubin: 0.8 mg/dL (ref 0.3–1.2)
Total Protein: 6.7 g/dL (ref 6.5–8.1)

## 2018-06-18 LAB — I-STAT TROPONIN, ED
TROPONIN I, POC: 0.01 ng/mL (ref 0.00–0.08)
Troponin i, poc: 0.01 ng/mL (ref 0.00–0.08)

## 2018-06-18 LAB — CBC WITH DIFFERENTIAL/PLATELET
ABS IMMATURE GRANULOCYTES: 0.03 10*3/uL (ref 0.00–0.07)
Basophils Absolute: 0.1 10*3/uL (ref 0.0–0.1)
Basophils Relative: 1 %
EOS PCT: 4 %
Eosinophils Absolute: 0.4 10*3/uL (ref 0.0–0.5)
HEMATOCRIT: 48.2 % (ref 39.0–52.0)
HEMOGLOBIN: 15.6 g/dL (ref 13.0–17.0)
Immature Granulocytes: 0 %
LYMPHS PCT: 27 %
Lymphs Abs: 2.7 10*3/uL (ref 0.7–4.0)
MCH: 28.7 pg (ref 26.0–34.0)
MCHC: 32.4 g/dL (ref 30.0–36.0)
MCV: 88.6 fL (ref 80.0–100.0)
MONO ABS: 0.6 10*3/uL (ref 0.1–1.0)
Monocytes Relative: 7 %
NEUTROS ABS: 6.1 10*3/uL (ref 1.7–7.7)
Neutrophils Relative %: 61 %
PLATELETS: DECREASED 10*3/uL (ref 150–400)
RBC: 5.44 MIL/uL (ref 4.22–5.81)
RDW: 13.9 % (ref 11.5–15.5)
WBC: 9.9 10*3/uL (ref 4.0–10.5)
nRBC: 0 % (ref 0.0–0.2)

## 2018-06-18 MED ORDER — ASPIRIN 81 MG PO CHEW
324.0000 mg | CHEWABLE_TABLET | Freq: Once | ORAL | Status: AC
  Administered 2018-06-18: 324 mg via ORAL
  Filled 2018-06-18: qty 4

## 2018-06-18 NOTE — ED Notes (Signed)
Pt returns from radiology remains on tele. 

## 2018-06-18 NOTE — ED Notes (Signed)
Patient transported to X-ray 

## 2018-06-18 NOTE — ED Provider Notes (Signed)
[facility] Provider Note   CSN: [id number] Arrival date & time: 06/18/18  0932     History   Chief Complaint Chief Complaint  Patient presents with  . Chest Pain    HPI Derrick [name] is a 38 y.o. male.  The history is provided by the patient and medical records. No language interpreter was used.   Derrick [name] is a 38 y.o. male  with a PMH as listed below who presents to the Emergency Department complaining of central chest pain which began at 5:30 AM.  Pain is described as sharp and constant.  He does feel as if it is chest pain, but also feels pain to his back in the same area, near his shoulder blade.  He feels that this is a deep pain that does not necessarily to the chest or back, but deep inside in between the 2.  He reports history of similar symptoms where he was admitted to the hospital for chest pain in 2017.  Per chart review, he had T wave inversion which was new from prior EKGs, therefore admitted for further work-up.  He underwent stress echo during admission that was normal.  He was subsequently discharged after reassuring work-up.  Patient does report associated shortness of breath which he describes as just not catching a good breath every now and then.  Breathing does seem to make his symptoms worse.  He denies any associated nausea, vomiting, headaches, visual changes, diaphoresis. No swelling or calf tenderness.  No recent travel/surgeries/immobilizations.  Patient has been working out much more than usual lately.  Since the new year started, he wanted to get back into shape.  He initially thought his pain was just feeling sore, therefore took 3 over-the-counter ibuprofen.  This did not help.  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  They checked his blood pressure and he reports it was in the 160s systolic.  He  is compliant with his blood pressure medication, therefore felt his blood pressure was much higher than it should be.  He decided to come to the emergency department for further care.  He has not taken aspirin today.  No other medications prior to arrival for his symptoms.  He denies history of hyperlipidemia, diabetes or known cardiac disease.  He is a daily smoker.  Past Medical History:  Diagnosis Date  . Angio-edema   . Chest pain    a. 06/16/15: normal stress echo  . GERD (gastroesophageal reflux disease)   . H/O fracture of nose   . MRSA infection    on legs,   . Tobacco abuse   . Urticaria     Patient Active Problem List   Diagnosis Date Noted  . Left lumbar radiculopathy 02/20/2017  . GERD (gastroesophageal reflux disease)   . Tobacco abuse   . Chest pain 06/15/2015  . IBS (irritable bowel syndrome) 04/05/2015  . Thrombocytopenia (HCC) 04/05/2015    Past Surgical History:  Procedure Laterality Date  . ESOPHAGOGASTRODUODENOSCOPY N/A 04/15/2015   Procedure: ESOPHAGOGASTRODUODENOSCOPY (EGD);  Surgeon: Malissa HippoNajeeb U Rehman, MD;  Location: [facility];  Service: Endoscopy;  Laterality: N/A;  1055 - moved to 12:10 - Ann notified pt  . right ankle fracture     X 3  . testicular tortion    . UMBILICAL HERNIA REPAIR  Home Medications    Prior to Admission medications   Medication Sig Start Date End Date Taking? Authorizing Provider  calcium carbonate (TUMS - DOSED IN MG ELEMENTAL CALCIUM) 500 MG chewable tablet Chew 1 tablet by mouth daily.   Yes [provider]  CHANTIX STARTING MONTH PAK 0.5 MG X 11 & 1 MG X 42 tablet Take 1 mg by mouth 2 (two) times daily. 06/12/18  Yes [provider]  diazepam (VALIUM) 2 MG tablet Take 2 mg by mouth as needed for anxiety.   Yes [provider]  Ibuprofen (ADVIL) 200 MG CAPS Take 200 mg by mouth 2 (two) times daily as needed (pain).   Yes [provider]  losartan (COZAAR) 100 MG tablet Take 100  mg by mouth daily. 05/28/18  Yes [provider]  Multiple Vitamin (MULTIVITAMIN WITH MINERALS) TABS tablet Take 1 tablet by mouth daily.   Yes [provider]  NON FORMULARY Take 4 tablets by mouth daily. Ultra Nox support for Muscle pumps   Yes [provider]  omeprazole (PRILOSEC) 40 MG capsule TAKE ONE CAPSULE BY MOUTH TWICE DAILY Patient taking differently: Take 40 mg by mouth daily.  08/06/17  Yes Setzer, Terri L, NP  triamcinolone ointment (KENALOG) 0.1 % Apply 1 application topically 2 (two) times daily. Patient taking differently: Apply 1 application topically 2 (two) times daily as needed (rash).  01/15/18  Yes Alfonse Spruce, MD  methocarbamol (ROBAXIN) 500 MG tablet Take 1 tablet (500 mg total) by mouth every 8 (eight) hours as needed for muscle spasms. Patient not taking: Reported on 01/15/2018 02/20/17   Judi Saa, DO    Family History History reviewed. No pertinent family history.  Social History Social History   Tobacco Use  . Smoking status: Current Every Day Smoker    Packs/day: 0.50    Years: 20.00    Pack years: 10.00    Types: Cigarettes  . Smokeless tobacco: Never Used  Substance Use Topics  . Alcohol use: Yes    Comment: socially  . Drug use: No     Allergies   Bee venom   Review of Systems Review of Systems  Respiratory: Positive for shortness of breath.   Cardiovascular: Positive for chest pain. Negative for palpitations and leg swelling.  All other systems reviewed and are negative.    Physical Exam Updated Vital Signs BP 120/80   Pulse (!) 57   Temp 97.6 F (36.4 C) (Oral)   Resp 17   SpO2 99%   Physical Exam Vitals signs and nursing note reviewed.  Constitutional:      General: He is not in acute distress.    Appearance: He is well-developed.  HENT:     Head: Normocephalic and atraumatic.  Neck:     Musculoskeletal: Neck supple.  Cardiovascular:     Rate and Rhythm: Normal rate and regular  rhythm.     Heart sounds: Normal heart sounds. No murmur.  Pulmonary:     Effort: Pulmonary effort is normal. No respiratory distress.     Breath sounds: Normal breath sounds.  Abdominal:     General: There is no distension.     Palpations: Abdomen is soft.     Tenderness: There is no abdominal tenderness.  Musculoskeletal:     Right lower leg: No edema.     Left lower leg: No edema.  Skin:    General: Skin is warm and dry.  Neurological:     Mental Status:  He is alert and oriented to person, place, and time.      ED Treatments / Results  Labs (all labs ordered are listed, but only abnormal results are displayed) Labs Reviewed  COMPREHENSIVE METABOLIC PANEL - Abnormal; Notable for the following components:      Result Value   BUN 25 (*)    Creatinine, Ser 1.29 (*)    All other components within normal limits  CBC WITH DIFFERENTIAL/PLATELET  I-STAT TROPONIN, ED  I-STAT TROPONIN, ED    EKG EKG Interpretation  Date/Time:  Wednesday June 18 2018 09:41:26 EST Ventricular Rate:  68 PR Interval:    QRS Duration: 97 QT Interval:  379 QTC Calculation: 403 R Axis:   95 Text Interpretation:  Sinus rhythm Borderline right axis deviation Baseline wander in lead(s) II III aVR aVF V3 V4 V5 V6 non specific st change in lead III and aVF resolved Otherwise no significant change Confirmed by Melene PlanFloyd, Dan [phone]([id number]) on 06/18/2018 9:57:19 AM Also confirmed by Melene PlanFloyd, Dan [phone]([id number]), editor Barbette Hairassel, Kerry [phone])  on 06/18/2018 2:17:14 PM   Radiology Dg Chest 2 View  Result Date: 06/18/2018 CLINICAL DATA:  Pain and shortness of breath EXAM: CHEST - 2 VIEW COMPARISON:  June 15, 2015 FINDINGS: Lungs are clear. Heart size and pulmonary vascularity are normal. No adenopathy. No pneumothorax. No bone lesions. IMPRESSION: No edema or consolidation. Electronically Signed   By: Bretta BangWilliam  Woodruff III M.D.   On: 06/18/2018 10:15    Procedures Procedures (including critical care time)  Medications  Ordered in ED Medications  aspirin chewable tablet 324 mg (324 mg Oral Given 06/18/18 1013)     Initial Impression / Assessment and Plan / ED Course  I have reviewed the triage vital signs and the nursing notes.  Pertinent labs & imaging results that were available during my care of the patient were reviewed by me and considered in my medical decision making (see chart for details).    Derrick [name] is a 38 y.o. male who presents to ED for chest pain associated with shortness of breath. On exam, patient afebrile, hemodynamically stable with normal cardiopulmonary exam.   Labs reviewed and reassuring with negative troponin x2.  CXR with no acute abnormalities.  EKG unchanged from previous.   Heart score low risk at 3. PERC negative  Patient's symptoms unlikely to be of cardiac etiology. Labs and imaging reviewed again prior to discharge. Patient has been advised to return to the ED if development of any exertional chest pain, trouble breathing, new/worsening symptoms or for any additional concerns. Evaluation does not show pathology that would require ongoing emergent intervention or inpatient treatment. Encouraged to follow up with PCP. Patient understands return precautions and follow up plan. All questions answered.   Final Clinical Impressions(s) / ED Diagnoses   Final diagnoses:  Chest pain  Chest pain with low risk for cardiac etiology    ED Discharge Orders    None       Ronney Honeywell, Chase PicketJaime Pilcher, PA-C 06/18/18 1528    Melene PlanFloyd, Dan, DO 06/18/18 1557

## 2018-06-18 NOTE — Discharge Instructions (Signed)
It was my pleasure taking care of you today!  ° °You were seen in the Emergency Department today for chest pain.  As we have discussed, today’s blood work and imaging are normal, but you may require further testing. ° °Please call your primary care physician to schedule a follow up appointment to discuss your ER visit today.  ° °Return to the Emergency Department if you experience any further chest pain/pressure/tightness, difficulty breathing, sudden sweating, or other symptoms that concern you. °

## 2018-06-18 NOTE — ED Triage Notes (Signed)
Pt has shoulder blade pain "inside" and down to elbow. He is smoker. Went to gym yesterday. Deep inspiration makes worse. Reports some SOB. Pain is constant.

## 2018-06-26 DIAGNOSIS — Z719 Counseling, unspecified: Secondary | ICD-10-CM | POA: Diagnosis not present

## 2018-06-26 DIAGNOSIS — E6609 Other obesity due to excess calories: Secondary | ICD-10-CM | POA: Diagnosis not present

## 2018-06-26 DIAGNOSIS — F419 Anxiety disorder, unspecified: Secondary | ICD-10-CM | POA: Diagnosis not present

## 2018-06-26 DIAGNOSIS — R7309 Other abnormal glucose: Secondary | ICD-10-CM | POA: Diagnosis not present

## 2018-06-26 DIAGNOSIS — Z6831 Body mass index (BMI) 31.0-31.9, adult: Secondary | ICD-10-CM | POA: Diagnosis not present

## 2018-09-30 ENCOUNTER — Other Ambulatory Visit (INDEPENDENT_AMBULATORY_CARE_PROVIDER_SITE_OTHER): Payer: Self-pay | Admitting: Internal Medicine

## 2018-09-30 DIAGNOSIS — K2100 Gastro-esophageal reflux disease with esophagitis, without bleeding: Secondary | ICD-10-CM

## 2018-12-05 DIAGNOSIS — I10 Essential (primary) hypertension: Secondary | ICD-10-CM | POA: Diagnosis not present

## 2018-12-05 DIAGNOSIS — D6942 Congenital and hereditary thrombocytopenia purpura: Secondary | ICD-10-CM | POA: Diagnosis not present

## 2018-12-05 DIAGNOSIS — Z6830 Body mass index (BMI) 30.0-30.9, adult: Secondary | ICD-10-CM | POA: Diagnosis not present

## 2018-12-05 DIAGNOSIS — Z0001 Encounter for general adult medical examination with abnormal findings: Secondary | ICD-10-CM | POA: Diagnosis not present

## 2018-12-05 DIAGNOSIS — R7309 Other abnormal glucose: Secondary | ICD-10-CM | POA: Diagnosis not present

## 2018-12-05 DIAGNOSIS — R001 Bradycardia, unspecified: Secondary | ICD-10-CM | POA: Diagnosis not present

## 2018-12-05 DIAGNOSIS — Z1389 Encounter for screening for other disorder: Secondary | ICD-10-CM | POA: Diagnosis not present

## 2018-12-05 DIAGNOSIS — Z719 Counseling, unspecified: Secondary | ICD-10-CM | POA: Diagnosis not present

## 2018-12-05 DIAGNOSIS — F419 Anxiety disorder, unspecified: Secondary | ICD-10-CM | POA: Diagnosis not present

## 2018-12-08 ENCOUNTER — Other Ambulatory Visit: Payer: Self-pay

## 2018-12-08 DIAGNOSIS — Z20822 Contact with and (suspected) exposure to covid-19: Secondary | ICD-10-CM

## 2018-12-11 DIAGNOSIS — J069 Acute upper respiratory infection, unspecified: Secondary | ICD-10-CM | POA: Diagnosis not present

## 2018-12-11 LAB — NOVEL CORONAVIRUS, NAA: SARS-CoV-2, NAA: NOT DETECTED

## 2018-12-13 ENCOUNTER — Other Ambulatory Visit: Payer: Self-pay

## 2018-12-13 ENCOUNTER — Emergency Department (HOSPITAL_COMMUNITY)
Admission: EM | Admit: 2018-12-13 | Discharge: 2018-12-13 | Disposition: A | Discharge status: Home / Self Care | Payer: BC Managed Care – PPO | Attending: Emergency Medicine | Admitting: Emergency Medicine

## 2018-12-13 ENCOUNTER — Encounter (HOSPITAL_COMMUNITY): Payer: Self-pay | Admitting: Emergency Medicine

## 2018-12-13 DIAGNOSIS — F17210 Nicotine dependence, cigarettes, uncomplicated: Secondary | ICD-10-CM | POA: Insufficient documentation

## 2018-12-13 DIAGNOSIS — Z209 Contact with and (suspected) exposure to unspecified communicable disease: Secondary | ICD-10-CM | POA: Diagnosis not present

## 2018-12-13 DIAGNOSIS — Z79899 Other long term (current) drug therapy: Secondary | ICD-10-CM | POA: Diagnosis not present

## 2018-12-13 DIAGNOSIS — R0602 Shortness of breath: Secondary | ICD-10-CM | POA: Insufficient documentation

## 2018-12-13 DIAGNOSIS — T7849XA Other allergy, initial encounter: Secondary | ICD-10-CM | POA: Insufficient documentation

## 2018-12-13 DIAGNOSIS — Z91018 Allergy to other foods: Secondary | ICD-10-CM

## 2018-12-13 DIAGNOSIS — R202 Paresthesia of skin: Secondary | ICD-10-CM | POA: Diagnosis not present

## 2018-12-13 DIAGNOSIS — T7840XA Allergy, unspecified, initial encounter: Secondary | ICD-10-CM | POA: Diagnosis not present

## 2018-12-13 MED ORDER — PREDNISONE 50 MG PO TABS
60.0000 mg | ORAL_TABLET | Freq: Once | ORAL | Status: AC
  Administered 2018-12-13: 60 mg via ORAL
  Filled 2018-12-13: qty 1

## 2018-12-13 MED ORDER — FAMOTIDINE 20 MG PO TABS
20.0000 mg | ORAL_TABLET | Freq: Once | ORAL | Status: AC
  Administered 2018-12-13: 20 mg via ORAL
  Filled 2018-12-13: qty 1

## 2018-12-13 MED ORDER — PREDNISONE 20 MG PO TABS: 40.0000 mg | ORAL_TABLET | Freq: Every day | ORAL | 0 refills | Status: DC

## 2018-12-13 NOTE — Discharge Instructions (Addendum)
You were seen today for likely an alpha gal allergy reaction.  Take prednisone for the next several days.  If you have any recurrence of symptoms you should use the EpiPen and take Benadryl.  Follow-up closely with your primary physician if needed.

## 2018-12-13 NOTE — ED Provider Notes (Signed)
[facility] EMERGENCY DEPARTMENT Provider Note   CSN: [id number] Arrival date & time: 12/13/18  0249    History   Chief Complaint Chief Complaint  Patient presents with  . Allergic Reaction    HPI Darcel SmallingJohn P Lamour is a 38 y.o. male.     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  Patient reports that he has had several episodes of symptoms related to his alpha gal in the past.  He states that these episodes typically occur in the early morning hours.  He gets tingling swelling in the feet and hands as well as the mouth.  He states that he has diarrhea.  He normally takes Benadryl and Tums with improvement of his symptoms.  Tonight he had similar symptoms.  However, he states that the symptoms progressed and he felt like he got more short of breath and was going to pass out.  He states that he got red all over.  He used his EpiPen.  Symptoms markedly improved; however, he now has some tremulousness.  He has never had to use his EpiPen before.  His reaction is consistent with prior episodes of alpha gal.  He states that he ate [employer] last night and "I think that is what did it."  He denies any other new exposures.  He currently denies any shortness of breath, chest pain, abdominal pain.  Of note, he does report recent negative COVID testing.  He is taking a Z-Pak for upper respiratory infection.  Past Medical History:  Diagnosis Date  . Angio-edema   . Chest pain    a. 06/16/15: normal stress echo  . GERD (gastroesophageal reflux disease)   . H/O fracture of nose   . MRSA infection    on legs,   . Tobacco abuse   . Urticaria     Patient Active Problem List   Diagnosis Date Noted  . Left lumbar radiculopathy 02/20/2017  . GERD (gastroesophageal reflux disease)   . Tobacco abuse   . Chest pain 06/15/2015  . IBS (irritable bowel syndrome) 04/05/2015  . Thrombocytopenia (HCC) 04/05/2015    Past Surgical History:   Procedure Laterality Date  . ESOPHAGOGASTRODUODENOSCOPY N/A 04/15/2015   Procedure: ESOPHAGOGASTRODUODENOSCOPY (EGD);  Surgeon: Malissa HippoNajeeb U Rehman, MD;  Location: [facility];  Service: Endoscopy;  Laterality: N/A;  1055 - moved to 12:10 - Ann notified pt  . right ankle fracture     X 3  . testicular tortion    . UMBILICAL HERNIA REPAIR          Home Medications    Prior to Admission medications   Medication Sig Start Date End Date Taking? Authorizing Provider  calcium carbonate (TUMS - DOSED IN MG ELEMENTAL CALCIUM) 500 MG chewable tablet Chew 1 tablet by mouth daily.    [provider]  CHANTIX STARTING MONTH PAK 0.5 MG X 11 & 1 MG X 42 tablet Take 1 mg by mouth 2 (two) times daily. 06/12/18   [provider]  diazepam (VALIUM) 2 MG tablet Take 2 mg by mouth as needed for anxiety.    [provider]  Ibuprofen (ADVIL) 200 MG CAPS Take 200 mg by mouth 2 (two) times daily as needed (pain).    [provider]  losartan (COZAAR) 100 MG tablet Take 100 mg by mouth daily. 05/28/18   [provider]  methocarbamol (ROBAXIN) 500 MG tablet Take 1 tablet (500 mg total)  by mouth every 8 (eight) hours as needed for muscle spasms. Patient not taking: Reported on 01/15/2018 02/20/17   Lyndal Pulley, DO  Multiple Vitamin (MULTIVITAMIN WITH MINERALS) TABS tablet Take 1 tablet by mouth daily.    [provider]  NON FORMULARY Take 4 tablets by mouth daily. Ultra Nox support for Muscle pumps    [provider]  omeprazole (PRILOSEC) 40 MG capsule Take 1 capsule by mouth twice daily 09/30/18   Setzer, Terri L, NP  predniSONE (DELTASONE) 20 MG tablet Take 2 tablets (40 mg total) by mouth daily. 12/13/18   Pauline Pegues, Barbette Hair, MD  triamcinolone ointment (KENALOG) 0.1 % Apply 1 application topically 2 (two) times daily. Patient taking differently: Apply 1 application topically 2 (two) times daily as needed (rash).  01/15/18   Valentina Shaggy, MD     Family History History reviewed. No pertinent family history.  Social History Social History   Tobacco Use  . Smoking status: Current Every Day Smoker    Packs/day: 0.50    Years: 20.00    Pack years: 10.00    Types: Cigarettes  . Smokeless tobacco: Never Used  Substance Use Topics  . Alcohol use: Yes    Comment: socially  . Drug use: No     Allergies   Bee venom   Review of Systems Review of Systems  Constitutional: Negative for fever.  HENT: Negative for trouble swallowing.   Respiratory: Positive for cough and shortness of breath.   Cardiovascular: Negative for chest pain.  Gastrointestinal: Positive for diarrhea and nausea. Negative for abdominal pain and vomiting.  Skin: Positive for rash.  Neurological: Negative for numbness.  All other systems reviewed and are negative.    Physical Exam Updated Vital Signs BP 121/77   Pulse (!) 57   Temp (!) 97.5 F (36.4 C) (Oral)   Resp 15   Ht 1.727 m (5\' 8" )   Wt 90.7 kg   SpO2 98%   BMI 30.41 kg/m   Physical Exam Vitals signs and nursing note reviewed.  Constitutional:      Appearance: He is well-developed. He is not ill-appearing.  HENT:     Head: Normocephalic and atraumatic.     Mouth/Throat:     Mouth: Mucous membranes are moist.  Eyes:     Pupils: Pupils are equal, round, and reactive to light.  Cardiovascular:     Rate and Rhythm: Normal rate and regular rhythm.     Heart sounds: Normal heart sounds. No murmur.  Pulmonary:     Effort: Pulmonary effort is normal. No respiratory distress.     Breath sounds: Normal breath sounds. No wheezing.  Abdominal:     General: Bowel sounds are normal.     Palpations: Abdomen is soft.     Tenderness: There is no abdominal tenderness. There is no rebound.  Musculoskeletal:        General: No swelling.  Skin:    General: Skin is warm and dry.     Findings: No rash.  Neurological:     Mental Status: He is alert and oriented to person, place, and time.   Psychiatric:        Mood and Affect: Mood normal.      ED Treatments / Results  Labs (all labs ordered are listed, but only abnormal results are displayed) Labs Reviewed - No data to display  EKG None  Radiology No results found.  Procedures Procedures (including critical care time)  Medications Ordered  in ED Medications  predniSONE (DELTASONE) tablet 60 mg (60 mg Oral Given 12/13/18 0337)  famotidine (PEPCID) tablet 20 mg (20 mg Oral Given 12/13/18 [id number])     Initial Impression / Assessment and Plan / ED Course  I have reviewed the triage vital signs and the nursing notes.  Pertinent labs & imaging results that were available during my care of the patient were reviewed by me and considered in my medical decision making (see chart for details).        Patient reports alpha gal allergy with current episode similar to this morning.  He did use his EpiPen.  He is currently nontoxic-appearing and vital signs are reassuring.  ABCs intact.  His physical exam is largely benign.  He was given prednisone and was monitored for several hours to ensure no rebound reaction from the EpiPen.  Patient remained stable.  Will be discharged on a short course of prednisone.  He has an additional EpiPen at home.    After history, exam, and medical workup I feel the patient has been appropriately medically screened and is safe for discharge home. Pertinent diagnoses were discussed with the patient. Patient was given return precautions.  Final Clinical Impressions(s) / ED Diagnoses   Final diagnoses:  Allergy to alpha-gal    ED Discharge Orders         Ordered    predniSONE (DELTASONE) 20 MG tablet  Daily     12/13/18 0535           Shon BatonHorton, Calla Wedekind F, MD 12/13/18 [phone]

## 2018-12-13 NOTE — ED Triage Notes (Addendum)
Patient is having an allergic reaction. Patient has taken his epi pen injection at home. Patient has taken 50 mg's of Benadryl around 01:20. Patient states that he feels jittery but the itching has subsided. EMS came out and patient refused EMS transport and decided to come POV.

## 2019-03-10 DIAGNOSIS — M545 Low back pain: Secondary | ICD-10-CM | POA: Diagnosis not present

## 2019-03-10 DIAGNOSIS — M519 Unspecified thoracic, thoracolumbar and lumbosacral intervertebral disc disorder: Secondary | ICD-10-CM | POA: Diagnosis not present

## 2019-03-18 ENCOUNTER — Other Ambulatory Visit: Payer: Self-pay

## 2019-03-18 DIAGNOSIS — Z20828 Contact with and (suspected) exposure to other viral communicable diseases: Secondary | ICD-10-CM | POA: Diagnosis not present

## 2019-03-18 DIAGNOSIS — Z20822 Contact with and (suspected) exposure to covid-19: Secondary | ICD-10-CM

## 2019-03-19 LAB — NOVEL CORONAVIRUS, NAA: SARS-CoV-2, NAA: NOT DETECTED

## 2019-04-02 DIAGNOSIS — M5136 Other intervertebral disc degeneration, lumbar region: Secondary | ICD-10-CM | POA: Diagnosis not present

## 2019-04-07 DIAGNOSIS — X58XXXD Exposure to other specified factors, subsequent encounter: Secondary | ICD-10-CM | POA: Diagnosis not present

## 2019-04-07 DIAGNOSIS — M545 Low back pain: Secondary | ICD-10-CM | POA: Diagnosis not present

## 2019-04-07 DIAGNOSIS — M5126 Other intervertebral disc displacement, lumbar region: Secondary | ICD-10-CM | POA: Diagnosis not present

## 2019-04-07 DIAGNOSIS — M5136 Other intervertebral disc degeneration, lumbar region: Secondary | ICD-10-CM | POA: Diagnosis not present

## 2019-04-13 DIAGNOSIS — M545 Low back pain: Secondary | ICD-10-CM | POA: Diagnosis not present

## 2019-04-17 DIAGNOSIS — M5136 Other intervertebral disc degeneration, lumbar region: Secondary | ICD-10-CM | POA: Diagnosis not present

## 2019-04-17 DIAGNOSIS — M545 Low back pain: Secondary | ICD-10-CM | POA: Diagnosis not present

## 2019-04-17 DIAGNOSIS — X58XXXD Exposure to other specified factors, subsequent encounter: Secondary | ICD-10-CM | POA: Diagnosis not present

## 2019-04-17 DIAGNOSIS — M5126 Other intervertebral disc displacement, lumbar region: Secondary | ICD-10-CM | POA: Diagnosis not present

## 2019-10-21 ENCOUNTER — Telehealth (INDEPENDENT_AMBULATORY_CARE_PROVIDER_SITE_OTHER): Payer: Self-pay | Admitting: Gastroenterology

## 2019-10-21 DIAGNOSIS — K2100 Gastro-esophageal reflux disease with esophagitis, without bleeding: Secondary | ICD-10-CM

## 2019-10-21 MED ORDER — OMEPRAZOLE 40 MG PO CPDR: 40.0000 mg | DELAYED_RELEASE_CAPSULE | Freq: Two times a day (BID) | ORAL | 1 refills | Status: DC

## 2019-10-21 NOTE — Telephone Encounter (Signed)
Received refill request from [facility] sent to pharmacy but last seen over 2 years ago and needs office visit prior to additional refill

## 2019-12-16 DIAGNOSIS — Z6829 Body mass index (BMI) 29.0-29.9, adult: Secondary | ICD-10-CM | POA: Diagnosis not present

## 2019-12-16 DIAGNOSIS — E781 Pure hyperglyceridemia: Secondary | ICD-10-CM | POA: Diagnosis not present

## 2019-12-16 DIAGNOSIS — R7309 Other abnormal glucose: Secondary | ICD-10-CM | POA: Diagnosis not present

## 2019-12-16 DIAGNOSIS — T7840XA Allergy, unspecified, initial encounter: Secondary | ICD-10-CM | POA: Diagnosis not present

## 2019-12-16 DIAGNOSIS — E663 Overweight: Secondary | ICD-10-CM | POA: Diagnosis not present

## 2019-12-16 DIAGNOSIS — Z1389 Encounter for screening for other disorder: Secondary | ICD-10-CM | POA: Diagnosis not present

## 2019-12-16 DIAGNOSIS — Z0000 Encounter for general adult medical examination without abnormal findings: Secondary | ICD-10-CM | POA: Diagnosis not present

## 2020-01-14 ENCOUNTER — Ambulatory Visit (INDEPENDENT_AMBULATORY_CARE_PROVIDER_SITE_OTHER): Payer: Self-pay | Admitting: Gastroenterology

## 2020-02-10 ENCOUNTER — Encounter (INDEPENDENT_AMBULATORY_CARE_PROVIDER_SITE_OTHER): Payer: Self-pay

## 2020-02-10 ENCOUNTER — Ambulatory Visit (INDEPENDENT_AMBULATORY_CARE_PROVIDER_SITE_OTHER): Payer: Self-pay | Admitting: Gastroenterology

## 2020-05-09 DIAGNOSIS — J22 Unspecified acute lower respiratory infection: Secondary | ICD-10-CM | POA: Diagnosis not present

## 2020-05-10 DIAGNOSIS — B349 Viral infection, unspecified: Secondary | ICD-10-CM | POA: Diagnosis not present

## 2020-05-10 DIAGNOSIS — R509 Fever, unspecified: Secondary | ICD-10-CM | POA: Diagnosis not present

## 2020-11-23 DIAGNOSIS — M545 Low back pain, unspecified: Secondary | ICD-10-CM | POA: Diagnosis not present

## 2020-12-07 DIAGNOSIS — M545 Low back pain, unspecified: Secondary | ICD-10-CM | POA: Diagnosis not present

## 2020-12-10 DIAGNOSIS — M5416 Radiculopathy, lumbar region: Secondary | ICD-10-CM | POA: Diagnosis not present

## 2020-12-19 DIAGNOSIS — U071 COVID-19: Secondary | ICD-10-CM | POA: Diagnosis not present

## 2021-01-19 ENCOUNTER — Encounter (HOSPITAL_COMMUNITY): Payer: Self-pay | Admitting: Emergency Medicine

## 2021-01-19 ENCOUNTER — Emergency Department (HOSPITAL_COMMUNITY)
Admission: EM | Admit: 2021-01-19 | Discharge: 2021-01-19 | Disposition: A | Discharge status: Home / Self Care | Payer: BC Managed Care – PPO | Attending: Emergency Medicine | Admitting: Emergency Medicine

## 2021-01-19 ENCOUNTER — Emergency Department (HOSPITAL_COMMUNITY): Payer: BC Managed Care – PPO

## 2021-01-19 DIAGNOSIS — F17210 Nicotine dependence, cigarettes, uncomplicated: Secondary | ICD-10-CM | POA: Insufficient documentation

## 2021-01-19 DIAGNOSIS — M5126 Other intervertebral disc displacement, lumbar region: Secondary | ICD-10-CM | POA: Insufficient documentation

## 2021-01-19 DIAGNOSIS — M545 Low back pain, unspecified: Secondary | ICD-10-CM | POA: Diagnosis not present

## 2021-01-19 LAB — CBC WITH DIFFERENTIAL/PLATELET
Abs Immature Granulocytes: 0.02 10*3/uL (ref 0.00–0.07)
Basophils Absolute: 0.1 10*3/uL (ref 0.0–0.1)
Basophils Relative: 1 %
Eosinophils Absolute: 0.4 10*3/uL (ref 0.0–0.5)
Eosinophils Relative: 5 %
HCT: 48.5 % (ref 39.0–52.0)
Hemoglobin: 15.9 g/dL (ref 13.0–17.0)
Immature Granulocytes: 0 %
Lymphocytes Relative: 22 %
Lymphs Abs: 1.7 10*3/uL (ref 0.7–4.0)
MCH: 30.2 pg (ref 26.0–34.0)
MCHC: 32.8 g/dL (ref 30.0–36.0)
MCV: 92 fL (ref 80.0–100.0)
Monocytes Absolute: 0.3 10*3/uL (ref 0.1–1.0)
Monocytes Relative: 4 %
Neutro Abs: 5.2 10*3/uL (ref 1.7–7.7)
Neutrophils Relative %: 68 %
Platelets: 74 10*3/uL — ABNORMAL LOW (ref 150–400)
RBC: 5.27 MIL/uL (ref 4.22–5.81)
RDW: 13.6 % (ref 11.5–15.5)
WBC: 7.6 10*3/uL (ref 4.0–10.5)
nRBC: 0 % (ref 0.0–0.2)

## 2021-01-19 LAB — BASIC METABOLIC PANEL
Anion gap: 7 (ref 5–15)
BUN: 20 mg/dL (ref 6–20)
CO2: 23 mmol/L (ref 22–32)
Calcium: 8.6 mg/dL — ABNORMAL LOW (ref 8.9–10.3)
Chloride: 109 mmol/L (ref 98–111)
Creatinine, Ser: 1.34 mg/dL — ABNORMAL HIGH (ref 0.61–1.24)
GFR, Estimated: >60 mL/min (ref >60)
Glucose, Bld: 99 mg/dL (ref 70–99)
Potassium: 3.9 mmol/L (ref 3.5–5.1)
Sodium: 139 mmol/L (ref 135–145)

## 2021-01-19 MED ORDER — OXYCODONE-ACETAMINOPHEN 5-325 MG PO TABS: 1.0000 | ORAL_TABLET | Freq: Four times a day (QID) | ORAL | 0 refills | Status: DC | PRN

## 2021-01-19 MED ORDER — HYDROCODONE-ACETAMINOPHEN 5-325 MG PO TABS
1.0000 | ORAL_TABLET | Freq: Once | ORAL | Status: AC
  Administered 2021-01-19: 1 via ORAL
  Filled 2021-01-19: qty 1

## 2021-01-19 MED ORDER — ONDANSETRON 4 MG PO TBDP
8.0000 mg | ORAL_TABLET | Freq: Once | ORAL | Status: AC
  Administered 2021-01-19: 8 mg via ORAL
  Filled 2021-01-19: qty 2

## 2021-01-19 NOTE — ED Triage Notes (Addendum)
Patient here for evaluation of lower back pain with intermittent loss of strength in left leg causing a fall this morning and a near fall later at PCP. PCP referred patient to [facility] to request stat MRI. Patient given 60mg  ketorolac and 10mg  decadron prior to arriving at [facility].

## 2021-01-19 NOTE — ED Provider Notes (Signed)
[facility] EMERGENCY DEPARTMENT Provider Note   CSN: [id number] Arrival date & time: 01/19/21  1352     History Chief Complaint  Patient presents with   Back Pain    Derrick [name] is a 40 y.o. male.   Back Pain Associated symptoms: numbness and weakness   Associated symptoms: no fever    Patient presents with low back pain that radiates down the left leg x4 days.  He has had history of pain similar to this due to herniated disks in 2018, but this pain feels different.  It started gradually after lifting weights on Monday.  It worsens when he first woke up in the morning on Tuesday and is impressively worsening since then.  The pain is primarily to the lower back, it radiates posteriorly down his left leg all the way to his toes.  He takes gabapentin for pain, has not had any relief.  He is also tried a shot of Toradol and Decadron from his primary care doctor without relief.  Pain is worse in the mornings, worsened by ambulation.  No known alleviating factors.  Patient was seen earlier today in his primary care office and had week noticed to the left leg causing him to fall, PCP was concerned about the current progressive nature of the pain and the loss of motor function so order that the patient get an MRI done stat in the ED.  Patient does not have a history of previous spinal surgeries, no history of IV drug use, no personal history of malignancy, no fevers, no urinary retention, no bilateral leg numbness or weakness, no saddle anesthesia.  Past Medical History:  Diagnosis Date   Angio-edema    Chest pain    a. 06/16/15: normal stress echo   GERD (gastroesophageal reflux disease)    H/O fracture of nose    MRSA infection    on legs,    Tobacco abuse    Urticaria     Patient Active Problem List   Diagnosis Date Noted   Left lumbar radiculopathy 02/20/2017   GERD (gastroesophageal reflux disease)    Tobacco abuse    Chest pain 06/15/2015   IBS (irritable  bowel syndrome) 04/05/2015   Thrombocytopenia (HCC) 04/05/2015    Past Surgical History:  Procedure Laterality Date   ESOPHAGOGASTRODUODENOSCOPY N/A 04/15/2015   Procedure: ESOPHAGOGASTRODUODENOSCOPY (EGD);  Surgeon: Malissa Hippo, MD;  Location: [facility]O SU[facility];  Service: Endoscopy;  Laterality: N/A;  1055 - moved to 12:10 - Ann notified pt   right ankle fracture     X 3   testicular tortion     UMBILICAL HERNIA REPAIR         No family history on file.  Social History   Tobacco Use   Smoking status: Every Day    Packs/day: 0.50    Years: 20.00    Pack years: 10.00    Types: Cigarettes   Smokeless tobacco: Never  Vaping Use   Vaping Use: Never used  Substance Use Topics   Alcohol use: Yes    Comment: socially   Drug use: No    Home Medications Prior to Admission medications   Medication Sig Start Date End Date Taking? Authorizing Provider  calcium carbonate (TUMS - DOSED IN MG ELEMENTAL CALCIUM) 500 MG chewable tablet Chew 1 tablet by mouth daily.    [provider]  CHANTIX STARTING MONTH PAK 0.5 MG X 11 & 1 MG X 42 tablet Take 1 mg by mouth  2 (two) times daily. 06/12/18   [provider]  diazepam (VALIUM) 2 MG tablet Take 2 mg by mouth as needed for anxiety.    [provider]  Ibuprofen (ADVIL) 200 MG CAPS Take 200 mg by mouth 2 (two) times daily as needed (pain).    [provider]  losartan (COZAAR) 100 MG tablet Take 100 mg by mouth daily. 05/28/18   [provider]  methocarbamol (ROBAXIN) 500 MG tablet Take 1 tablet (500 mg total) by mouth every 8 (eight) hours as needed for muscle spasms. Patient not taking: Reported on 01/15/2018 02/20/17   Judi Saa, DO  Multiple Vitamin (MULTIVITAMIN WITH MINERALS) TABS tablet Take 1 tablet by mouth daily.    [provider]  NON FORMULARY Take 4 tablets by mouth daily. Ultra Nox support for Muscle pumps    [provider]  omeprazole (PRILOSEC) 40 MG capsule  Take 1 capsule (40 mg total) by mouth 2 (two) times daily. 10/21/19   Ardelia Mems, PA-C  predniSONE (DELTASONE) 20 MG tablet Take 2 tablets (40 mg total) by mouth daily. 12/13/18   Horton, Mayer Masker, MD  triamcinolone ointment (KENALOG) 0.1 % Apply 1 application topically 2 (two) times daily. Patient taking differently: Apply 1 application topically 2 (two) times daily as needed (rash).  01/15/18   Alfonse Spruce, MD    Allergies    Alpha-gal and Bee venom  Review of Systems   Review of Systems  Constitutional:  Negative for fatigue and fever.  Musculoskeletal:  Positive for back pain and gait problem. Negative for neck pain.  Skin:  Negative for rash.  Neurological:  Positive for weakness and numbness. Negative for dizziness, syncope and light-headedness.   Physical Exam Updated Vital Signs BP 127/86 (BP Location: Left Arm)   Pulse (!) 55   Temp 97.8 F (36.6 C)   Resp 12   SpO2 99%   Physical Exam Vitals and nursing note reviewed. Exam conducted with a chaperone present.  Constitutional:      General: He is not in acute distress.    Appearance: Normal appearance.  HENT:     Head: Normocephalic and atraumatic.  Eyes:     General: No scleral icterus.    Extraocular Movements: Extraocular movements intact.     Pupils: Pupils are equal, round, and reactive to light.  Cardiovascular:     Rate and Rhythm: Regular rhythm. Bradycardia present.     Pulses: Normal pulses.     Comments: DP and PT are 2+ bilaterally Musculoskeletal:        General: Tenderness present. No signs of injury.     Cervical back: Normal range of motion and neck supple. No tenderness.     Comments: Tenderness to midline of lower lumbar spine.  Decreased range of motion to internal and external rotation.   Skin:    Coloration: Skin is not jaundiced.  Neurological:     Mental Status: He is alert. Mental status is at baseline.     Coordination: Coordination normal.     Comments: Cranial nerves  III through XII are grossly intact.  Grip strength is equal bilaterally, left leg is weaker than right leg to dorsiflexion and plantarflexion, left leg raise also elicits pain to the lumbar spine.  Sensation to light touch is grossly intact.    ED Results / Procedures / Treatments   Labs (all labs ordered are listed, but only abnormal results are displayed) Labs Reviewed  CBC WITH DIFFERENTIAL/PLATELET  BASIC METABOLIC PANEL    EKG None  Radiology No results found.  Procedures Procedures   Medications Ordered in ED Medications - No data to display  ED Course  I have reviewed the triage vital signs and the nursing notes.  Pertinent labs & imaging results that were available during my care of the patient were reviewed by me and considered in my medical decision making (see chart for details).  Clinical Course as of 01/19/21 2215  Thu Jan 19, 2021  1616 CBC with Differential/Platelet(!) No leukocytosis, no anemia.  Patient has thrombopenia, but this is a chronic issue.  Improved from last count. [HS]  1617 Basic metabolic panel(!) No electrolyte derangement, creatinine is at baseline. [HS]    Clinical Course User Index [HS] Theron Arista, PA-C   MDM Rules/Calculators/A&P                           Patient vital signs are stable, he is nontoxic-appearing.  He is able to ambulate, although does not elicit pain.  No focal deficits on neuro exam, he does have impaired strength to the left leg in comparison to the right.  MRI ordered due to request from PCP and concerned about progressing symptoms.  I doubt epidural abscess given the lack of IV drug use.  Also low suspicion for fracture or trauma given he has no history of herniated disc and no recent acute trauma.  Doubt cauda equina given the lack of red flag symptoms.  MRI is consistent with herniated disc worsening and S1 radiculopathy.  We will have patient follow-up as planned with his neurosurgeon.  He is appropriate for  discharge at this time.  Final Clinical Impression(s) / ED Diagnoses Final diagnoses:  None    Rx / DC Orders ED Discharge Orders     None        Theron Arista, Cordelia Poche 01/19/21 2217    Cheryll Cockayne, MD 01/19/21 [phone]

## 2021-01-19 NOTE — Discharge Instructions (Addendum)
Please schedule a follow-up appointment with your neurosurgery physician.  In the meantime I am prescribing you some pain medicine, you can take that as needed every 6 hours for the pain.  Do not drink or drive on it.  MRI results: At L5-S1, there is worsening of disease with what is now a left  posterolateral disc herniation with caudal migration of a disc  fragment in the left lateral recess behind S1, displacing and likely  compressing the left S1 nerve. Discogenic endplate marrow changes at  this level could relate to low back pain.     At L4-5, there is bulging of the disc and endplate osteophytes more  prominent on the left. No compressive canal stenosis. Mild left  foraminal encroachment but without apparent compression of the L4  nerve.

## 2021-01-19 NOTE — ED Provider Notes (Signed)
Emergency Medicine Provider Triage Evaluation Note  DIN Derrick [name] , a 40 y.o. male  was evaluated in triage.  Pt complains of back pain.  Review of Systems  Positive: Back pain, left leg weakness Negative: Fever, bowel/bladder incontinence, saddle anesthesia  Physical Exam  BP (!) 152/91 (BP Location: Right Arm)   Pulse (!) 57   Temp 97.8 F (36.6 C)   Resp 18   SpO2 100%  Gen:   Awake, no distress   Resp:  Normal effort  MSK:   Moves extremities without difficulty  Other:  TTP to Lspine with positive L straight leg raise.  DP pulse 2+ bilaterally  Medical Decision Making  Medically screening exam initiated at 2:20 PM.  Appropriate orders placed.  Derrick [name] was informed that the remainder of the evaluation will be completed by another provider, this initial triage assessment does not replace that evaluation, and the importance of remaining in the ED until their evaluation is complete.  Hx of DDD and lumbar herniated disk here at the request of PCP for lumbar spine MRI due to worsening L leg numbness, weakness and falling x 2-3 days.    Fayrene Helper, PA-C 01/19/21 1422    Pricilla Loveless, MD 01/19/21 1600

## 2021-01-31 DIAGNOSIS — M5416 Radiculopathy, lumbar region: Secondary | ICD-10-CM | POA: Diagnosis not present

## 2021-02-03 ENCOUNTER — Ambulatory Visit: Payer: Self-pay | Admitting: Orthopedic Surgery

## 2021-02-10 NOTE — Pre-Procedure Instructions (Signed)
Surgical Instructions    Your procedure is scheduled on Thursday, September 22nd, 2022.  Report to [facility] Main Entrance "A" at 05:30 A.M., then check in with the Admitting office.  Call this number if you have problems the morning of surgery:  [phone]   If you have any questions prior to your surgery date call [phone]: Open Monday-Friday 8am-4pm    Remember:  Do not eat after midnight the night before your surgery  You may drink clear liquids until 04:30 A.M. the morning of your surgery.   Clear liquids allowed are: Water, Non-Citrus Juices (without pulp), Carbonated Beverages, Clear Tea, Black Coffee ONLY (NO MILK, CREAM OR POWDERED CREAMER of any kind), and Gatorade    Enhanced Recovery after Surgery for Orthopedics Enhanced Recovery after Surgery is a protocol used to improve the stress on your body and your recovery after surgery.  Patient Instructions  The day of surgery (if you do NOT have diabetes):  Drink ONE (1) Pre-Surgery Clear Ensure by 04:30 am the morning of surgery   This drink was given to you during your hospital  pre-op appointment visit. Nothing else to drink after completing the  Pre-Surgery Clear Ensure.        If you have questions, please contact your surgeon's office.    Take these medicines the morning of surgery with A SIP OF WATER AS NEEDED: diazepam (VALIUM)  methocarbamol (ROBAXIN) omeprazole (PRILOSEC)  oxyCODONE-acetaminophen (PERCOCET/ROXICET)    As of today, STOP taking any Aspirin (unless otherwise instructed by your surgeon) Aleve, Naproxen, Ibuprofen, Motrin, Advil, Goody's, BC's, all herbal medications, fish oil, and all vitamins.          Do not wear jewelry Do not wear lotions, powders, colognes, or deodorant. Men may shave face and neck. Do not bring valuables to the hospital.            [facility] is not responsible for any belongings or valuables.  Do NOT Smoke (Tobacco/Vaping)  24 hours prior to your  procedure If you use a CPAP at night, you may bring your mask for your overnight stay.   Contacts, glasses, dentures or bridgework may not be worn into surgery, please bring cases for these belongings   For patients admitted to the hospital, discharge time will be determined by your treatment team.   Patients discharged the day of surgery will not be allowed to drive home, and someone needs to stay with them for 24 hours.  NO VISITORS WILL BE ALLOWED IN PRE-OP WHERE PATIENTS GET READY FOR SURGERY.  ONLY 1 SUPPORT PERSON MAY BE PRESENT WHILE YOU ARE IN SURGERY.  IF YOU ARE TO BE ADMITTED, ONCE YOU ARE IN YOUR ROOM YOU WILL BE ALLOWED TWO (2) VISITORS.  Minor children may have two parents present. Special consideration for safety and communication needs will be reviewed on a case by case basis.  Special instructions:    Oral Hygiene is also important to reduce your risk of infection.  Remember - BRUSH YOUR TEETH THE MORNING OF SURGERY WITH YOUR REGULAR TOOTHPASTE   [facility]- Preparing For Surgery  Before surgery, you can play an important role. Because skin is not sterile, your skin needs to be as free of germs as possible. You can reduce the number of germs on your skin by washing with CHG (chlorahexidine gluconate) Soap before surgery.  CHG is an antiseptic cleaner which kills germs and bonds with the skin to continue killing germs even after washing.  Please do not use if you have an allergy to CHG or antibacterial soaps. If your skin becomes reddened/irritated stop using the CHG.  Do not shave (including legs and underarms) for at least 48 hours prior to first CHG shower. It is OK to shave your face.  Please follow these instructions carefully.     Shower the NIGHT BEFORE SURGERY and the MORNING OF SURGERY with CHG Soap.   If you chose to wash your hair, wash your hair first as usual with your normal shampoo. After you shampoo, rinse your hair and body thoroughly to remove the  shampoo.  Then [employer] and genitals (private parts) with your normal soap and rinse thoroughly to remove soap.  After that Use CHG Soap as you would any other liquid soap. You can apply CHG directly to the skin and wash gently with a scrungie or a clean washcloth.   Apply the CHG Soap to your body ONLY FROM THE NECK DOWN.  Do not use on open wounds or open sores. Avoid contact with your eyes, ears, mouth and genitals (private parts). Wash Face and genitals (private parts)  with your normal soap.   Wash thoroughly, paying special attention to the area where your surgery will be performed.  Thoroughly rinse your body with warm water from the neck down.  DO NOT shower/wash with your normal soap after using and rinsing off the CHG Soap.  Pat yourself dry with a CLEAN TOWEL.  Wear CLEAN PAJAMAS to bed the night before surgery  Place CLEAN SHEETS on your bed the night before your surgery  DO NOT SLEEP WITH PETS.   Day of Surgery:  Take a shower with CHG soap. Wear Clean/Comfortable clothing the morning of surgery Do not apply any deodorants/lotions.   Remember to brush your teeth WITH YOUR REGULAR TOOTHPASTE.   Please read over the following fact sheets that you were given.

## 2021-02-13 ENCOUNTER — Ambulatory Visit: Payer: Self-pay | Admitting: Orthopedic Surgery

## 2021-02-13 ENCOUNTER — Encounter (HOSPITAL_COMMUNITY)
Admission: RE | Admit: 2021-02-13 | Discharge: 2021-02-13 | Disposition: A | Discharge status: Home / Self Care | Payer: BC Managed Care – PPO | Source: Ambulatory Visit | Attending: Specialist | Admitting: Specialist

## 2021-02-13 ENCOUNTER — Encounter (HOSPITAL_COMMUNITY): Payer: Self-pay

## 2021-02-13 ENCOUNTER — Other Ambulatory Visit: Payer: Self-pay

## 2021-02-13 ENCOUNTER — Ambulatory Visit (HOSPITAL_COMMUNITY)
Admission: RE | Admit: 2021-02-13 | Discharge: 2021-02-13 | Disposition: A | Discharge status: Home / Self Care | Payer: BC Managed Care – PPO | Source: Ambulatory Visit | Attending: Orthopedic Surgery | Admitting: Orthopedic Surgery

## 2021-02-13 DIAGNOSIS — F17210 Nicotine dependence, cigarettes, uncomplicated: Secondary | ICD-10-CM | POA: Insufficient documentation

## 2021-02-13 DIAGNOSIS — Z01818 Encounter for other preprocedural examination: Secondary | ICD-10-CM | POA: Diagnosis not present

## 2021-02-13 DIAGNOSIS — M5126 Other intervertebral disc displacement, lumbar region: Secondary | ICD-10-CM | POA: Insufficient documentation

## 2021-02-13 DIAGNOSIS — Z01812 Encounter for preprocedural laboratory examination: Secondary | ICD-10-CM | POA: Diagnosis not present

## 2021-02-13 DIAGNOSIS — Z20822 Contact with and (suspected) exposure to covid-19: Secondary | ICD-10-CM | POA: Diagnosis not present

## 2021-02-13 DIAGNOSIS — M47816 Spondylosis without myelopathy or radiculopathy, lumbar region: Secondary | ICD-10-CM | POA: Diagnosis not present

## 2021-02-13 DIAGNOSIS — M47817 Spondylosis without myelopathy or radiculopathy, lumbosacral region: Secondary | ICD-10-CM | POA: Diagnosis not present

## 2021-02-13 LAB — BASIC METABOLIC PANEL
Anion gap: 8 (ref 5–15)
BUN: 16 mg/dL (ref 6–20)
CO2: 25 mmol/L (ref 22–32)
Calcium: 9.3 mg/dL (ref 8.9–10.3)
Chloride: 107 mmol/L (ref 98–111)
Creatinine, Ser: 1.38 mg/dL — ABNORMAL HIGH (ref 0.61–1.24)
GFR, Estimated: >60 mL/min (ref >60)
Glucose, Bld: 87 mg/dL (ref 70–99)
Potassium: 4 mmol/L (ref 3.5–5.1)
Sodium: 140 mmol/L (ref 135–145)

## 2021-02-13 LAB — SURGICAL PCR SCREEN
MRSA, PCR: NEGATIVE
Staphylococcus aureus: NEGATIVE

## 2021-02-13 LAB — CBC
HCT: 49.7 % (ref 39.0–52.0)
Hemoglobin: 16 g/dL (ref 13.0–17.0)
MCH: 29.6 pg (ref 26.0–34.0)
MCHC: 32.2 g/dL (ref 30.0–36.0)
MCV: 92 fL (ref 80.0–100.0)
Platelets: 73 10*3/uL — ABNORMAL LOW (ref 150–400)
RBC: 5.4 MIL/uL (ref 4.22–5.81)
RDW: 13.2 % (ref 11.5–15.5)
WBC: 10.6 10*3/uL — ABNORMAL HIGH (ref 4.0–10.5)
nRBC: 0 % (ref 0.0–0.2)

## 2021-02-13 LAB — SARS CORONAVIRUS 2 (TAT 6-24 HRS): SARS Coronavirus 2: NEGATIVE

## 2021-02-13 NOTE — H&P (View-Only) (Signed)
Derrick [name] is an 40 y.o. male.   Chief Complaint: back and left leg pain HPI: Reason for Visit: Diagnositc Results (lumbar MRI) Context: The patient is 3 years and 11 months out from previous W/C injury. Location (Lower Extremity): lower back pain ; leg pain on the left, , Severity: pain level 4/10 Timing: constant Associated Symptoms: numbness/tingling Are you working? not at all Medications: The patient is currently not taking any medications for the back Notes: Patient was seen in [facility] ED on 01/19/21 and MRI was done. The patient is 7 1/2 weeks out from left L5-S1 ESI/ Patient was seen in the emergency room. He had an epidural 7 weeks ago given temporary help. This is been worsening over the past 3 months. He is tried his exercises at home without avail though he felt it started at a period of time when he was not doing his exercises it radiates to the outer aspect of the foot and into the calf.  Past Medical History:  Diagnosis Date  . Angio-edema   . Chest pain    a. 06/16/15: normal stress echo  . GERD (gastroesophageal reflux disease)   . H/O fracture of nose   . MRSA infection    on legs,   . Tobacco abuse   . Urticaria     Past Surgical History:  Procedure Laterality Date  . ESOPHAGOGASTRODUODENOSCOPY N/A 04/15/2015   Procedure: ESOPHAGOGASTRODUODENOSCOPY (EGD);  Surgeon: Najeeb U Rehman, MD;  Location: [facility];  Service: Endoscopy;  Laterality: N/A;  1055 - moved to 12:10 - Ann notified pt  . right ankle fracture     X 3  . testicular tortion    . UMBILICAL HERNIA REPAIR      No family history on file. Social History:  reports that he has been smoking cigarettes. He has a 10.00 pack-year smoking history. He has never used smokeless tobacco. He reports current alcohol use. He reports that he does not use drugs.  Allergies:  Allergies  Allergen Reactions  . Alpha-Gal Hives and Itching    Sometimes red meat causes itching, hives, and stomach cramps.  . Bee  Venom Swelling    (Not in a hospital admission)   No results found for this or any previous visit (from the past 48 hour(s)). No results found.  Review of Systems  Constitutional: Negative.   HENT: Negative.    Eyes: Negative.   Respiratory: Negative.    Cardiovascular: Negative.   Gastrointestinal: Negative.   Endocrine: Negative.   Genitourinary: Negative.   Musculoskeletal:  Positive for back pain and myalgias.  Skin: Negative.   Neurological:  Positive for weakness and numbness.   There were no vitals taken for this visit. Physical Exam Constitutional:      Appearance: Normal appearance.  HENT:     Head: Normocephalic.     Right Ear: External ear normal.     Left Ear: External ear normal.     Nose: Nose normal.     Mouth/Throat:     Pharynx: Oropharynx is clear.  Eyes:     Conjunctiva/sclera: Conjunctivae normal.  Cardiovascular:     Rate and Rhythm: Normal rate.     Pulses: Normal pulses.  Pulmonary:     Effort: Pulmonary effort is normal.  Abdominal:     General: Bowel sounds are normal.  Musculoskeletal:     Cervical back: Normal range of motion.     Comments: Gait and Station: Appearance: ambulating with no assistive devices and antalgic   gait.  Constitutional: General Appearance: healthy-appearing and distress (mild).  Psychiatric: Mood and Affect: active and alert.  Cardiovascular System: Edema Right: none; Dorsalis and posterior tibial pulses 2+. Edema Left: none.  Abdomen: Inspection and Palpation: non-distended and no tenderness.  Skin: Inspection and palpation: no rash.  Lumbar Spine: Inspection: normal alignment. Bony Palpation of the Lumbar Spine: tender at lumbosacral junction.. Bony Palpation of the Right Hip: no tenderness of the greater trochanter and tenderness of the SI joint; Pelvis stable. Bony Palpation of the Left Hip: no tenderness of the greater trochanter and tenderness of the SI joint. Soft Tissue Palpation on the Right: No flank  pain with percussion. Active Range of Motion: limited flexion and extention.  Motor Strength: L1 Motor Strength on the Right: hip flexion iliopsoas 5/5. L1 Motor Strength on the Left: hip flexion iliopsoas 5/5. L2-L4 Motor Strength on the Right: knee extension quadriceps 5/5. L2-L4 Motor Strength on the Left: knee extension quadriceps 5/5. L5 Motor Strength on the Right: ankle dorsiflexion tibialis anterior 5/5 and great toe extension extensor hallucis longus 5/5. L5 Motor Strength on the Left: ankle dorsiflexion tibialis anterior 5/5 and great toe extension extensor hallucis longus 5/5. S1 Motor Strength on the Right: plantar flexion gastrocnemius /5. S1 Motor Strength on the Left: plantar flexion gastrocnemius 3/5.  Neurological System: Knee Reflex Right: normal (2). Knee Reflex Left: normal (2). Ankle Reflex Right: normal (2). Ankle Reflex Left: diminished (1). Babinski Reflex Right: plantar reflex absent. Babinski Reflex Left: plantar reflex absent. Sensation on the Right: normal distal extremities. Sensation on the Left: normal distal extremities. Special Tests on the Right: no clonus of the ankle/knee. Special Tests on the Left: no clonus of the ankle/knee and seated straight leg raising test positive.  Skin:    General: Skin is warm and dry.  Neurological:     Mental Status: He is alert.   MRI demonstrates a new small to moderate left paracentral disc herniation with moderate caudal migration resulting in a more pronounced impingement on the left lateral displacement of the descending left S1 nerve root. Contact on the right. Small foraminal disc protrusion at L4-5. 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 Was seen in the emergency room he now has diminished plantar flexion diminished Achilles reflex And MRI now indicating extruded fragment compressing the S1 nerve root. We discussed lumbar decompression on the left versus living with his symptoms which he feels is having significant pain with this and there has been no resolution like in the past  I had an extensive discussion with the patient concerning the pathology relevant anatomy and treatment options. At this point exhausting conservative treatment and in the presence of a neurologic deficit we discussed microlumbar decompression. I discussed the risks and benefits including bleeding, infection, DVT, PE, anesthetic complications, worsening in their symptoms, improvement in their symptoms, C SF leakage,  epidural fibrosis, need for future surgeries such as revision discectomy and lumbar fusion. I also indicated that this is an operation to basically decompress the nerve root to allow recovery as opposed to fixing a herniated disc if iy is encountered and that the incidence of recurrent chest disc herniation can approach 15%. Also that nerve root recovery is variable and may not recover completely.  I discussed the operative course including overnight in the hospital. Immediate ambulation. Follow-up in 2 weeks for suture removal. 6 weeks until healing of the herniation followed by 6 weeks of reconditioning and strengthening of the core musculature. Also discussed the need to employ the concepts of disc pressure management and core motion  following the surgery to minimize the risk of recurrent disc herniation. We will obtain preoperative clearance i if necessary and proceed accordingly.  Patient's had a history of MRSA. We use Vanco and gent. Preoperative decolonization. No history of DVT. He is otherwise healthy. Used to take blood pressure medication now he is off it his pressure is fine   Plan microlumbar decompression L5-S1 left  Lilla Callejo M Carmesha Morocco, PA-C for Dr Beane 02/13/2021, 12:27 PM    

## 2021-02-13 NOTE — Progress Notes (Signed)
PCP - MD Assun[facility] Found Cardiologist - pt denies  PPM/ICD - n/a  Chest x-ray - n/a Lumbar Spine x-ray- 02/13/21 in PAT EKG - 02/13/21 in PAT Stress Test - 06/16/2015 ECHO - 06/16/2015 Cardiac Cath - pt denies  Sleep Study - pt denies  Blood Thinner Instructions: n/a Aspirin Instructions: As of today, STOP taking any Aspirin (unless otherwise instructed by your surgeon) Aleve, Naproxen, Ibuprofen, Motrin, Advil, Goody's, BC's, all herbal medications, fish oil, and all vitamins.  ERAS Protcol - yes, clears until 0430 PRE-SURGERY Ensure or G2- ensure  COVID TEST- 02/13/2021 in PAT  Anesthesia review: yes, platelet count was 74 on CBC done 01/19/21. Pt reports that he has seen hematology in the past due to low platelets and that "it hasn't been a problem." Repeat CBC done in PAT. Awaiting results.  Patient denies shortness of breath, fever, cough and chest pain at PAT appointment   All instructions explained to the patient, with a verbal understanding of the material. Patient agrees to go over the instructions while at home for a better understanding. Patient also instructed to self quarantine after being tested for COVID-19. The opportunity to ask questions was provided.

## 2021-02-13 NOTE — Progress Notes (Signed)
Platelet count of 73 on CBC. Left VM at Dr. Ermelinda Das office. Also sent staff message to Dr. Shelle Iron.

## 2021-02-13 NOTE — H&P (Signed)
Derrick [name] is an 40 y.o. male.   Chief Complaint: back and left leg pain HPI: Reason for Visit: Diagnositc Results (lumbar MRI) Context: The patient is 3 years and 11 months out from previous W/C injury. Location (Lower Extremity): lower back pain ; leg pain on the left, , Severity: pain level 4/10 Timing: constant Associated Symptoms: numbness/tingling Are you working? not at all Medications: The patient is currently not taking any medications for the back Notes: Patient was seen in [facility] ED on 01/19/21 and MRI was done. The patient is 7 1/2 weeks out from left L5-S1 ESI/ Patient was seen in the emergency room. He had an epidural 7 weeks ago given temporary help. This is been worsening over the past 3 months. He is tried his exercises at home without avail though he felt it started at a period of time when he was not doing his exercises it radiates to the outer aspect of the foot and into the calf.  Past Medical History:  Diagnosis Date  . Angio-edema   . Chest pain    a. 06/16/15: normal stress echo  . GERD (gastroesophageal reflux disease)   . H/O fracture of nose   . MRSA infection    on legs,   . Tobacco abuse   . Urticaria     Past Surgical History:  Procedure Laterality Date  . ESOPHAGOGASTRODUODENOSCOPY N/A 04/15/2015   Procedure: ESOPHAGOGASTRODUODENOSCOPY (EGD);  Surgeon: Malissa Hippo, MD;  Location: [facility];  Service: Endoscopy;  Laterality: N/A;  1055 - moved to 12:10 - Ann notified pt  . right ankle fracture     X 3  . testicular tortion    . UMBILICAL HERNIA REPAIR      No family history on file. Social History:  reports that he has been smoking cigarettes. He has a 10.00 pack-year smoking history. He has never used smokeless tobacco. He reports current alcohol use. He reports that he does not use drugs.  Allergies:  Allergies  Allergen Reactions  . Alpha-Gal Hives and Itching    Sometimes red meat causes itching, hives, and stomach cramps.  . Bee  Venom Swelling    (Not in a hospital admission)   No results found for this or any previous visit (from the past 48 hour(s)). No results found.  Review of Systems  Constitutional: Negative.   HENT: Negative.    Eyes: Negative.   Respiratory: Negative.    Cardiovascular: Negative.   Gastrointestinal: Negative.   Endocrine: Negative.   Genitourinary: Negative.   Musculoskeletal:  Positive for back pain and myalgias.  Skin: Negative.   Neurological:  Positive for weakness and numbness.   There were no vitals taken for this visit. Physical Exam Constitutional:      Appearance: Normal appearance.  HENT:     Head: Normocephalic.     Right Ear: External ear normal.     Left Ear: External ear normal.     Nose: Nose normal.     Mouth/Throat:     Pharynx: Oropharynx is clear.  Eyes:     Conjunctiva/sclera: Conjunctivae normal.  Cardiovascular:     Rate and Rhythm: Normal rate.     Pulses: Normal pulses.  Pulmonary:     Effort: Pulmonary effort is normal.  Abdominal:     General: Bowel sounds are normal.  Musculoskeletal:     Cervical back: Normal range of motion.     Comments: Gait and Station: Appearance: ambulating with no assistive devices and antalgic  gait.  Constitutional: General Appearance: healthy-appearing and distress (mild).  Psychiatric: Mood and Affect: active and alert.  Cardiovascular System: Edema Right: none; Dorsalis and posterior tibial pulses 2+. Edema Left: none.  Abdomen: Inspection and Palpation: non-distended and no tenderness.  Skin: Inspection and palpation: no rash.  Lumbar Spine: Inspection: normal alignment. Bony Palpation of the Lumbar Spine: tender at lumbosacral junction.. Bony Palpation of the Right Hip: no tenderness of the greater trochanter and tenderness of the SI joint; Pelvis stable. Bony Palpation of the Left Hip: no tenderness of the greater trochanter and tenderness of the SI joint. Soft Tissue Palpation on the Right: No flank  pain with percussion. Active Range of Motion: limited flexion and extention.  Motor Strength: L1 Motor Strength on the Right: hip flexion iliopsoas 5/5. L1 Motor Strength on the Left: hip flexion iliopsoas 5/5. L2-L4 Motor Strength on the Right: knee extension quadriceps 5/5. L2-L4 Motor Strength on the Left: knee extension quadriceps 5/5. L5 Motor Strength on the Right: ankle dorsiflexion tibialis anterior 5/5 and great toe extension extensor hallucis longus 5/5. L5 Motor Strength on the Left: ankle dorsiflexion tibialis anterior 5/5 and great toe extension extensor hallucis longus 5/5. S1 Motor Strength on the Right: plantar flexion gastrocnemius /5. S1 Motor Strength on the Left: plantar flexion gastrocnemius 3/5.  Neurological System: Knee Reflex Right: normal (2). Knee Reflex Left: normal (2). Ankle Reflex Right: normal (2). Ankle Reflex Left: diminished (1). Babinski Reflex Right: plantar reflex absent. Babinski Reflex Left: plantar reflex absent. Sensation on the Right: normal distal extremities. Sensation on the Left: normal distal extremities. Special Tests on the Right: no clonus of the ankle/knee. Special Tests on the Left: no clonus of the ankle/knee and seated straight leg raising test positive.  Skin:    General: Skin is warm and dry.  Neurological:     Mental Status: He is alert.   MRI demonstrates a new small to moderate left paracentral disc herniation with moderate caudal migration resulting in a more pronounced impingement on the left lateral displacement of the descending left S1 nerve root. Contact on the right. Small foraminal disc protrusion at L4-5. 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 Was seen in the emergency room he now has diminished plantar flexion diminished Achilles reflex And MRI now indicating extruded fragment compressing the S1 nerve root. We discussed lumbar decompression on the left versus living with his symptoms which he feels is having significant pain with this and there has been no resolution like in the past  I had an extensive discussion with the patient concerning the pathology relevant anatomy and treatment options. At this point exhausting conservative treatment and in the presence of a neurologic deficit we discussed microlumbar decompression. I discussed the risks and benefits including bleeding, infection, DVT, PE, anesthetic complications, worsening in their symptoms, improvement in their symptoms, C SF leakage,  epidural fibrosis, need for future surgeries such as revision discectomy and lumbar fusion. I also indicated that this is an operation to basically decompress the nerve root to allow recovery as opposed to fixing a herniated disc if iy is encountered and that the incidence of recurrent chest disc herniation can approach 15%. Also that nerve root recovery is variable and may not recover completely.  I discussed the operative course including overnight in the hospital. Immediate ambulation. Follow-up in 2 weeks for suture removal. 6 weeks until healing of the herniation followed by 6 weeks of reconditioning and strengthening of the core musculature. Also discussed the need to employ the concepts of disc pressure management and core motion  following the surgery to minimize the risk of recurrent disc herniation. We will obtain preoperative clearance i if necessary and proceed accordingly.  Patient's had a history of MRSA. We use Vanco and gent. Preoperative decolonization. No history of DVT. He is otherwise healthy. Used to take blood pressure medication now he is off it his pressure is fine   Plan microlumbar decompression L5-S1 left  Dorothy Spark, PA-C for Dr Shelle Iron 02/13/2021, 12:27 PM

## 2021-02-14 NOTE — Anesthesia Preprocedure Evaluation (Addendum)
Anesthesia Evaluation  Patient identified by MRN, date of birth, ID band Patient awake    Reviewed: Allergy & Precautions, H&P , NPO status , Patient's Chart, lab work & pertinent test results  Airway Mallampati: II   Neck ROM: full    Dental   Pulmonary Current Smoker,    breath sounds clear to auscultation       Cardiovascular negative cardio ROS   Rhythm:regular Rate:Normal     Neuro/Psych  Neuromuscular disease    GI/Hepatic GERD  ,  Endo/Other    Renal/GU      Musculoskeletal   Abdominal   Peds  Hematology   Anesthesia Other Findings   Reproductive/Obstetrics                            Anesthesia Physical Anesthesia Plan  ASA: 2  Anesthesia Plan: General   Post-op Pain Management:    Induction: Intravenous  PONV Risk Score and Plan: 1 and Ondansetron, Dexamethasone, Midazolam and Treatment may vary due to age or medical condition  Airway Management Planned: Oral ETT  Additional Equipment:   Intra-op Plan:   Post-operative Plan: Extubation in OR  Informed Consent: I have reviewed the patients History and Physical, chart, labs and discussed the procedure including the risks, benefits and alternatives for the proposed anesthesia with the patient or authorized representative who has indicated his/her understanding and acceptance.     Dental advisory given  Plan Discussed with: CRNA, Anesthesiologist and Surgeon  Anesthesia Plan Comments: (See APP note by Joslyn Hy, FNP )       Anesthesia Quick Evaluation

## 2021-02-14 NOTE — Progress Notes (Addendum)
Anesthesia Chart Review:   Case: [id number] Date/Time: 02/16/21 0715   Procedure: Microlumbar decompression L5-S1 left (Left) - 3 C-BED   Anesthesia type: General   Pre-op diagnosis: Stenosis, Herniated disc L5-S1 Left   [facility] / [facility]   Surgeons: Jene Every, MD       DISCUSSION: Pt is 40 years old with hx chronic thrombocytopenia. 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   Remaining hx is unremarkable except current smoker.   RN sent platelet lab results to surgeon.   I reviewed hx thrombocytopenia with Dr. Renold Don.    VS: BP (!) 122/98   Pulse 74   Temp 36.6 C (Oral)   Resp 18   Ht 5\' 8"  (1.727 m)   Wt 81.6 kg   SpO2 98%   BMI 27.37 kg/m   PROVIDERS: - PCP is , MD   LABS:  - platelet count 73. This is consistent with prior results dating back to at least 2005  (all labs ordered are listed, but only abnormal results are displayed)  Labs Reviewed  CBC - Abnormal; Notable for the following components:      Result Value   WBC 10.6 (*)    Platelets 73 (*)    All other components within normal limits  BASIC METABOLIC PANEL - Abnormal; Notable for the following components:   Creatinine, Ser 1.38 (*)    All other components within normal limits  SURGICAL PCR SCREEN  SARS CORONAVIRUS 2 (TAT 6-24 HRS)    EKG 02/13/21: Sinus bradycardia. Rightward axis   CV: Stress echo 06/16/15:  - Normal study after maximal exercise.    Past Medical History:  Diagnosis Date   Angio-edema    Chest pain    a. 06/16/15: normal stress echo   GERD (gastroesophageal reflux disease)    H/O fracture of nose    MRSA infection    on legs,    Tobacco abuse    Urticaria     Past Surgical History:  Procedure Laterality Date    ESOPHAGOGASTRODUODENOSCOPY N/A 04/15/2015   Procedure: ESOPHAGOGASTRODUODENOSCOPY (EGD);  Surgeon: 04/17/2015, MD;  Location: [facility];  Service: Endoscopy;  Laterality: N/A;  1055 - moved to 12:10 - Ann notified pt   right ankle fracture     X 3   testicular tortion     UMBILICAL HERNIA REPAIR      MEDICATIONS:  calcium carbonate (TUMS - DOSED IN MG ELEMENTAL CALCIUM) 500 MG chewable tablet   diazepam (VALIUM) 2 MG tablet   Ibuprofen 200 MG CAPS   methocarbamol (ROBAXIN) 500 MG tablet   mupirocin ointment (BACTROBAN) 2 %   omeprazole (PRILOSEC) 40 MG capsule   oxyCODONE-acetaminophen (PERCOCET/ROXICET) 5-325 MG tablet   No current facility-administered medications for this encounter.    If no changes, I anticipate pt can proceed with surgery as scheduled.   Malissa Hippo, PhD, FNP-BC [facility]/Anesthesiology Phone: [phone] 02/14/2021 10:42 AM

## 2021-02-15 ENCOUNTER — Encounter (HOSPITAL_COMMUNITY): Payer: Self-pay | Admitting: Specialist

## 2021-02-16 ENCOUNTER — Encounter (HOSPITAL_COMMUNITY)
Admission: RE | Disposition: A | Discharge status: Home / Self Care | Payer: Self-pay | Source: Ambulatory Visit | Attending: Specialist

## 2021-02-16 ENCOUNTER — Other Ambulatory Visit: Payer: Self-pay

## 2021-02-16 ENCOUNTER — Ambulatory Visit (HOSPITAL_COMMUNITY)
Admission: RE | Admit: 2021-02-16 | Discharge: 2021-02-16 | Disposition: A | Discharge status: Home / Self Care | Payer: BC Managed Care – PPO | Source: Ambulatory Visit | Attending: Specialist | Admitting: Specialist

## 2021-02-16 ENCOUNTER — Ambulatory Visit (HOSPITAL_COMMUNITY): Payer: BC Managed Care – PPO

## 2021-02-16 ENCOUNTER — Ambulatory Visit (HOSPITAL_COMMUNITY): Payer: BC Managed Care – PPO | Admitting: Certified Registered"

## 2021-02-16 ENCOUNTER — Ambulatory Visit (HOSPITAL_COMMUNITY): Payer: BC Managed Care – PPO | Admitting: Emergency Medicine

## 2021-02-16 ENCOUNTER — Encounter (HOSPITAL_COMMUNITY): Payer: Self-pay | Admitting: Specialist

## 2021-02-16 DIAGNOSIS — Z9889 Other specified postprocedural states: Secondary | ICD-10-CM | POA: Diagnosis not present

## 2021-02-16 DIAGNOSIS — D696 Thrombocytopenia, unspecified: Secondary | ICD-10-CM | POA: Diagnosis not present

## 2021-02-16 DIAGNOSIS — Z419 Encounter for procedure for purposes other than remedying health state, unspecified: Secondary | ICD-10-CM

## 2021-02-16 DIAGNOSIS — M4807 Spinal stenosis, lumbosacral region: Secondary | ICD-10-CM | POA: Diagnosis not present

## 2021-02-16 DIAGNOSIS — M5117 Intervertebral disc disorders with radiculopathy, lumbosacral region: Secondary | ICD-10-CM | POA: Diagnosis not present

## 2021-02-16 DIAGNOSIS — F17210 Nicotine dependence, cigarettes, uncomplicated: Secondary | ICD-10-CM | POA: Diagnosis not present

## 2021-02-16 DIAGNOSIS — M48061 Spinal stenosis, lumbar region without neurogenic claudication: Secondary | ICD-10-CM | POA: Diagnosis not present

## 2021-02-16 DIAGNOSIS — M5127 Other intervertebral disc displacement, lumbosacral region: Secondary | ICD-10-CM | POA: Diagnosis not present

## 2021-02-16 DIAGNOSIS — M5126 Other intervertebral disc displacement, lumbar region: Secondary | ICD-10-CM

## 2021-02-16 DIAGNOSIS — K589 Irritable bowel syndrome without diarrhea: Secondary | ICD-10-CM | POA: Diagnosis not present

## 2021-02-16 DIAGNOSIS — K219 Gastro-esophageal reflux disease without esophagitis: Secondary | ICD-10-CM | POA: Diagnosis not present

## 2021-02-16 HISTORY — PX: LUMBAR LAMINECTOMY/DECOMPRESSION MICRODISCECTOMY: SHX5026

## 2021-02-16 SURGERY — LUMBAR LAMINECTOMY/DECOMPRESSION MICRODISCECTOMY 1 LEVEL
Anesthesia: General | Site: Spine Lumbar | Laterality: Left

## 2021-02-16 MED ORDER — PHENYLEPHRINE HCL-NACL 20-0.9 MG/250ML-% IV SOLN
INTRAVENOUS | Status: AC
  Filled 2021-02-16: qty 500

## 2021-02-16 MED ORDER — PROPOFOL 10 MG/ML IV BOLUS
INTRAVENOUS | Status: DC | PRN
  Administered 2021-02-16: 200 mg via INTRAVENOUS

## 2021-02-16 MED ORDER — ONDANSETRON HCL 4 MG/2ML IJ SOLN
INTRAMUSCULAR | Status: DC | PRN
  Administered 2021-02-16: 4 mg via INTRAVENOUS

## 2021-02-16 MED ORDER — CHLORHEXIDINE GLUCONATE 0.12 % MT SOLN
OROMUCOSAL | Status: AC
  Administered 2021-02-16: 15 mL via OROMUCOSAL
  Filled 2021-02-16: qty 15

## 2021-02-16 MED ORDER — BUPIVACAINE-EPINEPHRINE 0.5% -1:200000 IJ SOLN
INTRAMUSCULAR | Status: AC
  Filled 2021-02-16: qty 1

## 2021-02-16 MED ORDER — DEXMEDETOMIDINE (PRECEDEX) IN NS 20 MCG/5ML (4 MCG/ML) IV SYRINGE
PREFILLED_SYRINGE | INTRAVENOUS | Status: DC | PRN
  Administered 2021-02-16 (×2): 5 ug via INTRAVENOUS

## 2021-02-16 MED ORDER — ACETAMINOPHEN 10 MG/ML IV SOLN
1000.0000 mg | INTRAVENOUS | Status: AC
  Administered 2021-02-16: 1000 mg via INTRAVENOUS
  Filled 2021-02-16: qty 100

## 2021-02-16 MED ORDER — ONDANSETRON HCL 4 MG/2ML IJ SOLN
INTRAMUSCULAR | Status: AC
  Filled 2021-02-16: qty 2

## 2021-02-16 MED ORDER — EPHEDRINE 5 MG/ML INJ
INTRAVENOUS | Status: AC
  Filled 2021-02-16: qty 5

## 2021-02-16 MED ORDER — THROMBIN 20000 UNITS EX SOLR: CUTANEOUS | Status: DC | PRN

## 2021-02-16 MED ORDER — DOCUSATE SODIUM 100 MG PO CAPS: 100.0000 mg | ORAL_CAPSULE | Freq: Two times a day (BID) | ORAL | 1 refills | Status: DC | PRN

## 2021-02-16 MED ORDER — FENTANYL CITRATE (PF) 250 MCG/5ML IJ SOLN
INTRAMUSCULAR | Status: DC | PRN
  Administered 2021-02-16 (×3): 50 ug via INTRAVENOUS
  Administered 2021-02-16: 100 ug via INTRAVENOUS

## 2021-02-16 MED ORDER — VANCOMYCIN HCL 1000 MG IV SOLR
INTRAVENOUS | Status: DC | PRN
  Administered 2021-02-16: 1000 mg via INTRAVENOUS

## 2021-02-16 MED ORDER — LIDOCAINE HCL (PF) 2 % IJ SOLN
INTRAMUSCULAR | Status: AC
  Filled 2021-02-16: qty 5

## 2021-02-16 MED ORDER — ROCURONIUM BROMIDE 10 MG/ML (PF) SYRINGE
PREFILLED_SYRINGE | INTRAVENOUS | Status: AC
  Filled 2021-02-16: qty 10

## 2021-02-16 MED ORDER — DEXAMETHASONE SODIUM PHOSPHATE 10 MG/ML IJ SOLN
INTRAMUSCULAR | Status: DC | PRN
  Administered 2021-02-16: 10 mg via INTRAVENOUS

## 2021-02-16 MED ORDER — OXYCODONE HCL 5 MG/5ML PO SOLN
5.0000 mg | Freq: Once | ORAL | Status: DC | PRN
Start: 2021-02-16 — End: 2021-02-16

## 2021-02-16 MED ORDER — OXYCODONE HCL 5 MG PO TABS: 5.0000 mg | ORAL_TABLET | ORAL | 0 refills | Status: DC | PRN

## 2021-02-16 MED ORDER — BUPIVACAINE-EPINEPHRINE 0.5% -1:200000 IJ SOLN
INTRAMUSCULAR | Status: DC | PRN
  Administered 2021-02-16: 4 mL

## 2021-02-16 MED ORDER — ONDANSETRON HCL 4 MG/2ML IJ SOLN
4.0000 mg | Freq: Four times a day (QID) | INTRAMUSCULAR | Status: AC | PRN
  Administered 2021-02-16: 4 mg via INTRAVENOUS

## 2021-02-16 MED ORDER — VANCOMYCIN HCL IN DEXTROSE 1-5 GM/200ML-% IV SOLN
1000.0000 mg | INTRAVENOUS | Status: DC
  Filled 2021-02-16: qty 200

## 2021-02-16 MED ORDER — LIDOCAINE 2% (20 MG/ML) 5 ML SYRINGE
INTRAMUSCULAR | Status: DC | PRN
Start: 2021-02-16 — End: 2021-02-16
  Administered 2021-02-16: 100 mg via INTRAVENOUS

## 2021-02-16 MED ORDER — CEFAZOLIN SODIUM-DEXTROSE 2-4 GM/100ML-% IV SOLN
2.0000 g | INTRAVENOUS | Status: AC
  Administered 2021-02-16: 2 g via INTRAVENOUS
  Filled 2021-02-16: qty 100

## 2021-02-16 MED ORDER — MIDAZOLAM HCL 2 MG/2ML IJ SOLN
INTRAMUSCULAR | Status: AC
  Filled 2021-02-16: qty 2

## 2021-02-16 MED ORDER — DEXMEDETOMIDINE (PRECEDEX) IN NS 20 MCG/5ML (4 MCG/ML) IV SYRINGE
PREFILLED_SYRINGE | INTRAVENOUS | Status: AC
  Filled 2021-02-16: qty 5

## 2021-02-16 MED ORDER — FENTANYL CITRATE (PF) 100 MCG/2ML IJ SOLN
INTRAMUSCULAR | Status: AC
  Filled 2021-02-16: qty 2

## 2021-02-16 MED ORDER — 0.9 % SODIUM CHLORIDE (POUR BTL) OPTIME
TOPICAL | Status: DC | PRN
  Administered 2021-02-16: 1000 mL

## 2021-02-16 MED ORDER — OXYCODONE HCL 5 MG PO TABS
5.0000 mg | ORAL_TABLET | Freq: Once | ORAL | Status: DC | PRN
Start: 2021-02-16 — End: 2021-02-16

## 2021-02-16 MED ORDER — FENTANYL CITRATE (PF) 100 MCG/2ML IJ SOLN
25.0000 ug | INTRAMUSCULAR | Status: DC | PRN
  Administered 2021-02-16 (×4): 25 ug via INTRAVENOUS

## 2021-02-16 MED ORDER — LIDOCAINE HCL (PF) 2 % IJ SOLN
INTRAMUSCULAR | Status: AC
  Filled 2021-02-16: qty 10

## 2021-02-16 MED ORDER — DEXAMETHASONE SODIUM PHOSPHATE 10 MG/ML IJ SOLN
INTRAMUSCULAR | Status: AC
  Filled 2021-02-16: qty 1

## 2021-02-16 MED ORDER — PROPOFOL 10 MG/ML IV BOLUS
INTRAVENOUS | Status: AC
  Filled 2021-02-16: qty 20

## 2021-02-16 MED ORDER — METHOCARBAMOL 500 MG PO TABS: 500.0000 mg | ORAL_TABLET | Freq: Three times a day (TID) | ORAL | 1 refills | Status: DC | PRN

## 2021-02-16 MED ORDER — CHLORHEXIDINE GLUCONATE 0.12 % MT SOLN: 15.0000 mL | Freq: Once | OROMUCOSAL | Status: AC

## 2021-02-16 MED ORDER — FENTANYL CITRATE (PF) 250 MCG/5ML IJ SOLN
INTRAMUSCULAR | Status: AC
  Filled 2021-02-16: qty 5

## 2021-02-16 MED ORDER — THROMBIN 20000 UNITS EX SOLR
CUTANEOUS | Status: AC
  Filled 2021-02-16: qty 20000

## 2021-02-16 MED ORDER — ROCURONIUM BROMIDE 10 MG/ML (PF) SYRINGE
PREFILLED_SYRINGE | INTRAVENOUS | Status: DC | PRN
  Administered 2021-02-16: 60 mg via INTRAVENOUS
  Administered 2021-02-16: 20 mg via INTRAVENOUS

## 2021-02-16 MED ORDER — LACTATED RINGERS IV SOLN
INTRAVENOUS | Status: DC
Start: 2021-02-16 — End: 2021-02-16

## 2021-02-16 MED ORDER — MIDAZOLAM HCL 2 MG/2ML IJ SOLN
INTRAMUSCULAR | Status: DC | PRN
  Administered 2021-02-16: 2 mg via INTRAVENOUS

## 2021-02-16 SURGICAL SUPPLY — 56 items
BAG COUNTER SPONGE SURGICOUNT (BAG) ×2 IMPLANT
BAG DECANTER FOR FLEXI CONT (MISCELLANEOUS) ×2 IMPLANT
BAND RUBBER #18 3X1/16 STRL (MISCELLANEOUS) ×4 IMPLANT
CLEANER TIP ELECTROSURG 2X2 (MISCELLANEOUS) ×2 IMPLANT
CNTNR URN SCR LID CUP LEK RST (MISCELLANEOUS) ×1 IMPLANT
CONT SPEC 4OZ STRL OR WHT (MISCELLANEOUS) ×2
DRAPE LAPAROTOMY 100X72X124 (DRAPES) ×2 IMPLANT
DRAPE MICROSCOPE LEICA (MISCELLANEOUS) ×2 IMPLANT
DRAPE SHEET LG 3/4 BI-LAMINATE (DRAPES) ×2 IMPLANT
DRAPE SURG 17X11 SM STRL (DRAPES) ×2 IMPLANT
DRAPE UTILITY XL STRL (DRAPES) ×2 IMPLANT
DRESSING AQUACEL AG SP 3.5X6 (GAUZE/BANDAGES/DRESSINGS) ×1 IMPLANT
DRSG AQUACEL AG ADV 3.5X 4 (GAUZE/BANDAGES/DRESSINGS) IMPLANT
DRSG AQUACEL AG ADV 3.5X 6 (GAUZE/BANDAGES/DRESSINGS) IMPLANT
DRSG AQUACEL AG SP 3.5X6 (GAUZE/BANDAGES/DRESSINGS) ×2
DRSG TELFA 3X8 NADH (GAUZE/BANDAGES/DRESSINGS) IMPLANT
DURAPREP 26ML APPLICATOR (WOUND CARE) ×2 IMPLANT
DURASEAL SPINE SEALANT 3ML (MISCELLANEOUS) IMPLANT
ELECT BLADE 4.0 EZ CLEAN MEGAD (MISCELLANEOUS) ×2
ELECT REM PT RETURN 9FT ADLT (ELECTROSURGICAL) ×2
ELECTRODE BLDE 4.0 EZ CLN MEGD (MISCELLANEOUS) ×1 IMPLANT
ELECTRODE REM PT RTRN 9FT ADLT (ELECTROSURGICAL) ×1 IMPLANT
GLOVE SURG POLYISO LF SZ7 (GLOVE) ×4 IMPLANT
GLOVE SURG POLYISO LF SZ7.5 (GLOVE) ×4 IMPLANT
GLOVE SURG POLYISO LF SZ8 (GLOVE) ×4 IMPLANT
GLOVE SURG UNDER POLY LF SZ7 (GLOVE) ×6 IMPLANT
GOWN STRL REUS W/ TWL LRG LVL3 (GOWN DISPOSABLE) ×1 IMPLANT
GOWN STRL REUS W/ TWL XL LVL3 (GOWN DISPOSABLE) ×1 IMPLANT
GOWN STRL REUS W/TWL LRG LVL3 (GOWN DISPOSABLE) ×2
GOWN STRL REUS W/TWL XL LVL3 (GOWN DISPOSABLE) ×2
IV CATH 14GX2 1/4 (CATHETERS) ×2 IMPLANT
KIT BASIN OR (CUSTOM PROCEDURE TRAY) ×2 IMPLANT
NEEDLE 22X1 1/2 (OR ONLY) (NEEDLE) ×2 IMPLANT
NEEDLE SPNL 18GX3.5 QUINCKE PK (NEEDLE) ×4 IMPLANT
PACK LAMINECTOMY NEURO (CUSTOM PROCEDURE TRAY) ×2 IMPLANT
PATTIES SURGICAL .25X.25 (GAUZE/BANDAGES/DRESSINGS) ×2 IMPLANT
PATTIES SURGICAL .75X.75 (GAUZE/BANDAGES/DRESSINGS) ×2 IMPLANT
SPONGE SURGIFOAM ABS GEL 100 (HEMOSTASIS) ×2 IMPLANT
SPONGE T-LAP 4X18 ~~LOC~~+RFID (SPONGE) ×4 IMPLANT
STAPLER VISISTAT (STAPLE) ×2 IMPLANT
STRIP CLOSURE SKIN 1/2X4 (GAUZE/BANDAGES/DRESSINGS) ×2 IMPLANT
SUT PROLENE 3 0 PS 1 (SUTURE) ×2 IMPLANT
SUT VIC AB 1 CT1 18XCR BRD 8 (SUTURE) ×1 IMPLANT
SUT VIC AB 1 CT1 27 (SUTURE)
SUT VIC AB 1 CT1 27XBRD ANTBC (SUTURE) IMPLANT
SUT VIC AB 1 CT1 8-18 (SUTURE) ×2
SUT VIC AB 1-0 CT2 27 (SUTURE) IMPLANT
SUT VIC AB 2-0 CP2 18 (SUTURE) ×2 IMPLANT
SUT VIC AB 2-0 CT1 27 (SUTURE)
SUT VIC AB 2-0 CT1 TAPERPNT 27 (SUTURE) IMPLANT
SUT VIC AB 2-0 CT2 27 (SUTURE) IMPLANT
SYR 3ML LL SCALE MARK (SYRINGE) ×2 IMPLANT
TOWEL GREEN STERILE (TOWEL DISPOSABLE) ×2 IMPLANT
TOWEL GREEN STERILE FF (TOWEL DISPOSABLE) ×2 IMPLANT
TRAY FOLEY MTR SLVR 16FR STAT (SET/KITS/TRAYS/PACK) ×2 IMPLANT
YANKAUER SUCT BULB TIP NO VENT (SUCTIONS) ×2 IMPLANT

## 2021-02-16 NOTE — Brief Op Note (Signed)
02/16/2021  9:46 AM  PATIENT:  Derrick [name]  40 y.o. male  PRE-OPERATIVE DIAGNOSIS:  Stenosis, Herniated disc L5-S1 Left  POST-OPERATIVE DIAGNOSIS:  Stenosis, Herniated disc L5-S1 Left  PROCEDURE:  Procedure(s): Left Lumbar Five-Sacral One Microlumbar decompression (Left)  SURGEON:  Surgeon(s) and Role:    Jene Every, MD - Primary  PHYSICIAN ASSISTANT:   ASSISTANTSSu Hilt    ANESTHESIA:   general  EBL:  25 mL   BLOOD ADMINISTERED:none  DRAINS: none   LOCAL MEDICATIONS USED:  MARCAINE     SPECIMEN:  No Specimen  DISPOSITION OF SPECIMEN:  N/A  COUNTS:  YES  TOURNIQUET:  * No tourniquets in log *  DICTATION: .Other Dictation: Dictation Number [id number]  PLAN OF CARE: Discharge to home after PACU  PATIENT DISPOSITION:  PACU - hemodynamically stable.   Delay start of Pharmacological VTE agent (>24hrs) due to surgical blood loss or risk of bleeding: yes

## 2021-02-16 NOTE — Interval H&P Note (Signed)
History and Physical Interval Note:  02/16/2021 7:18 AM  Derrick [name]  has presented today for surgery, with the diagnosis of Stenosis, Herniated disc L5-S1 Left.  The various methods of treatment have been discussed with the patient and family. After consideration of risks, benefits and other options for treatment, the patient has consented to  Procedure(s) with comments: Microlumbar decompression L5-S1 left (Left) - 3 C-BED as a surgical intervention.  The patient's history has been reviewed, patient examined, no change in status, stable for surgery.  I have reviewed the patient's chart and labs.  Questions were answered to the patient's satisfaction.     Lacretia Leigh [name]  EHL 4/5 right. SLR  +. PF 4/5

## 2021-02-16 NOTE — Op Note (Signed)
NAME: Derrick [name], Derrick [name] MEDICAL RECORD NO: [id number] ACCOUNT NO: [id number] DATE OF BIRTH: 08-02-1980 FACILITY: [facility] LOCATION: [facility] PHYSICIAN: [name], MD  Operative Report   DATE OF PROCEDURE: 02/16/2021  PREOPERATIVE DIAGNOSES:  Spinal stenosis and herniated nucleus pulposus L5-S1, left.  POSTOPERATIVE DIAGNOSES:  Spinal stenosis and herniated nucleus pulposus L5-S1, left.  PROCEDURE PERFORMED: 1.  Microlumbar decompression L5-S1, left. 2.  Foraminotomies L5-S1, left. 3.  Microdiskectomy L5-S1, left.  ANESTHESIA:  General.  ASSISTANT: Skip Mayer, PA.  HISTORY:  A 40 year old with left lower extremity radicular pain, L5-S1 nerve root distribution, secondary to disk herniation and stenosis. He was indicated for microlumbar decompression, failing conservative treatment.  Risks and benefits discussed  including bleeding, infection, damage to neurovascular structures, no change in symptoms, worsening symptoms, DVT, PE, anesthetic complications, etc.  TECHNIQUE:  With the patient in supine position and after induction of adequate general anesthesia, 2 grams Kefzol and vancomycin, he was placed prone on the Wilson frame.  All bony prominences were well padded.  Lumbar region was prepped and draped in  the usual sterile fashion.  Two 18-gauge spinal needle was utilized to localize 5-1 interspace, confirmed with x-ray.  Next, an incision was made from the spinous process of L5-S1.  Subcutaneous tissue was dissected with electrocautery was utilized to  achieve hemostasis.  Dorsal lumbar fascia divided in line with skin incision.  Paraspinous muscle elevated from lamina 5 and 1.  McCulloch retractor was placed.  Operating microscope was draped and brought in the surgical field, with it confirmatory  radiograph identified an L5-S1.  There was a small interlaminar window.  I therefore performed a hemilaminotomy of the caudad edge  of 5 with a 3 mm Kerrison, preserving the pars.  I then  detached ligamentum flavum from the cephalad edge of S1.  I  then placed a Penfield 4, just released the ligamentum flavum over the pedicle of S1.  Mobilizing the S1 nerve root medially.  I then decompressed the lateral recess to the medial border of the pedicle with a 2 mm Kerrison.  I performed a foraminotomy of  S1.  The S1 nerve root was compressed in the lateral recess.  There was a tethering epidural venous plexus.  I therefore cauterized that and lysed that.  This mobilized the S1 nerve root.  With this mobilized, there was a herniated disk noted extending  caudad from the disk space.  With the neural element well protected, I performed an annulotomy of the L5-S1 with a 15 blade and mobilized multiple fragments with a nerve hook and a micropituitary.  I then mobilized that with an Epstein preserving the  endplates and from the disk space.  I irrigated the disk space with catheter lavage and additional fragments were retrieved.  Following this, there was no residual herniated disk noted.  I did remove a portion of the superior articulating process of L5  with a 2 mm Kerrison and performed a foraminotomy of L5.  There was some narrowing of that as well.  Next, a confirmatory radiograph obtained.  Penfield in the disk space.  I copiously irrigated with antibiotic irrigation.  Bone wax was placed on the  cancellous surfaces.  No evidence of CSF leakage or active bleeding.  The S1 nerve root, which was found to be erythematous and edematous and compressing the lateral recess initially appeared erythematous and edematous; however, there was 1 cm of  excursion of the S1 nerve root medial to pedicle without tension.  The ligamentum  flavum had been partially removed from the interspace.  A Woodson retractor passed freely out the foramen of L5 and S1 without difficulty.  We checked the shoulder of the  nerve root, the axilla and in the foramen, no residual disk herniation.  Copiously irrigated the wound.  No  evidence of CSF leakage or active bleeding.  We removed the [facility] retractor, irrigated once again and closed the dorsal lumbar fascia with #1  Vicryl in interrupted figure-of-eight suture, subcutaneous with 2-0 and skin with Prolene.  Sterile dressing was applied.  He was placed supine on the hospital bed, extubated without difficulty and transported to the recovery room in satisfactory  condition.  The patient tolerated the procedure well.  No complications.  Assistant, Skip Mayer, PA was used for gentle intermittent neural retraction, suction, closure, patient positioning, etc.   NIK D: 02/16/2021 9:53:02 am T: 02/16/2021 10:37:00 am  JOB: [id number]/ [id number]

## 2021-02-16 NOTE — Transfer of Care (Signed)
Immediate Anesthesia Transfer of Care Note  Patient: Derrick [name]  Procedure(s) Performed: Left Lumbar Five-Sacral One Microlumbar decompression (Left: Spine Lumbar)  Patient Location: PACU  Anesthesia Type:General  Level of Consciousness: awake, alert , oriented and patient cooperative  Airway & Oxygen Therapy: Patient Spontanous Breathing and Patient connected to nasal cannula oxygen  Post-op Assessment: Report given to RN, Post -op Vital signs reviewed and stable and Patient moving all extremities X 4  Post vital signs: Reviewed and stable  Last Vitals:  Vitals Value Taken Time  BP    Temp    Pulse 66 02/16/21 1009  Resp 20 02/16/21 1009  SpO2 99 % 02/16/21 1009  Vitals shown include unvalidated device data.  Last Pain:  Vitals:   02/16/21 0627  TempSrc:   PainSc: 5       Patients Stated Pain Goal: 0 (02/16/21 9767)  Complications: No notable events documented.

## 2021-02-16 NOTE — Anesthesia Procedure Notes (Signed)
Procedure Name: Intubation Date/Time: 02/16/2021 7:38 AM Performed by: Annamary Carolin, CRNA Pre-anesthesia Checklist: Patient identified, Emergency Drugs available, Suction available and Patient being monitored Patient Re-evaluated:Patient Re-evaluated prior to induction Oxygen Delivery Method: Circle System Utilized Preoxygenation: Pre-oxygenation with 100% oxygen Induction Type: IV induction Ventilation: Mask ventilation without difficulty Laryngoscope Size: Mac and 3 Grade View: Grade I Tube type: Oral Tube size: 7.5 mm Number of attempts: 1 Airway Equipment and Method: Stylet Placement Confirmation: ETT inserted through vocal cords under direct vision, positive ETCO2 and breath sounds checked- equal and bilateral Secured at: 23 cm Tube secured with: Tape Dental Injury: Teeth and Oropharynx as per pre-operative assessment  Comments: With ease

## 2021-02-16 NOTE — Discharge Instructions (Signed)
Walk As Tolerated utilizing back precautions.  No bending, twisting, or lifting.  No driving for 2 weeks.   °Aquacel dressing may remain in place until follow up. May shower with aquacel dressing in place. If the dressing peels off or becomes saturated, you may remove aquacel dressing and place gauze and tape dressing which should be kept clean and dry and changed daily. Do not remove steri-strips if they are present. °See Dr. Fallou Hulbert in office in 10 to 14 days. Begin taking aspirin 81mg per day starting 4 days after your surgery if not allergic to aspirin or on another blood thinner. °Walk daily even outside. Use a cane or walker only if necessary. °Avoid sitting on soft sofas. ° °

## 2021-02-17 ENCOUNTER — Encounter (HOSPITAL_COMMUNITY): Payer: Self-pay | Admitting: Specialist

## 2021-02-17 NOTE — Anesthesia Postprocedure Evaluation (Signed)
Anesthesia Post Note  Patient: Derrick [name]  Procedure(s) Performed: Left Lumbar Five-Sacral One Microlumbar decompression (Left: Spine Lumbar)     Patient location during evaluation: PACU Anesthesia Type: General Level of consciousness: awake and alert Pain management: pain level controlled Vital Signs Assessment: post-procedure vital signs reviewed and stable Respiratory status: spontaneous breathing, nonlabored ventilation, respiratory function stable and patient connected to nasal cannula oxygen Cardiovascular status: blood pressure returned to baseline and stable Postop Assessment: no apparent nausea or vomiting Anesthetic complications: no   No notable events documented.  Last Vitals:  Vitals:   02/16/21 1030 02/16/21 1100  BP: 110/73 113/80  Pulse: 65 61  Resp: 16 17  Temp:    SpO2: 98% 100%    Last Pain:  Vitals:   02/16/21 1100  TempSrc:   PainSc: 3                  Hajra Port S

## 2021-03-15 DIAGNOSIS — M5459 Other low back pain: Secondary | ICD-10-CM | POA: Diagnosis not present

## 2021-03-15 DIAGNOSIS — Z4889 Encounter for other specified surgical aftercare: Secondary | ICD-10-CM | POA: Diagnosis not present

## 2021-03-21 DIAGNOSIS — M5459 Other low back pain: Secondary | ICD-10-CM | POA: Diagnosis not present

## 2021-03-21 DIAGNOSIS — Z4889 Encounter for other specified surgical aftercare: Secondary | ICD-10-CM | POA: Diagnosis not present

## 2021-03-23 DIAGNOSIS — M5459 Other low back pain: Secondary | ICD-10-CM | POA: Diagnosis not present

## 2021-03-23 DIAGNOSIS — Z4889 Encounter for other specified surgical aftercare: Secondary | ICD-10-CM | POA: Diagnosis not present

## 2021-03-27 DIAGNOSIS — M5459 Other low back pain: Secondary | ICD-10-CM | POA: Diagnosis not present

## 2021-03-27 DIAGNOSIS — Z4889 Encounter for other specified surgical aftercare: Secondary | ICD-10-CM | POA: Diagnosis not present

## 2021-03-29 DIAGNOSIS — Z4889 Encounter for other specified surgical aftercare: Secondary | ICD-10-CM | POA: Diagnosis not present

## 2021-03-29 DIAGNOSIS — M5459 Other low back pain: Secondary | ICD-10-CM | POA: Diagnosis not present

## 2021-04-03 DIAGNOSIS — Z4889 Encounter for other specified surgical aftercare: Secondary | ICD-10-CM | POA: Diagnosis not present

## 2021-04-03 DIAGNOSIS — M5459 Other low back pain: Secondary | ICD-10-CM | POA: Diagnosis not present

## 2021-04-05 DIAGNOSIS — M5459 Other low back pain: Secondary | ICD-10-CM | POA: Diagnosis not present

## 2021-04-05 DIAGNOSIS — Z4889 Encounter for other specified surgical aftercare: Secondary | ICD-10-CM | POA: Diagnosis not present

## 2021-04-10 DIAGNOSIS — M5459 Other low back pain: Secondary | ICD-10-CM | POA: Diagnosis not present

## 2021-04-10 DIAGNOSIS — Z4889 Encounter for other specified surgical aftercare: Secondary | ICD-10-CM | POA: Diagnosis not present

## 2021-04-13 DIAGNOSIS — Z4889 Encounter for other specified surgical aftercare: Secondary | ICD-10-CM | POA: Diagnosis not present

## 2021-04-13 DIAGNOSIS — M5459 Other low back pain: Secondary | ICD-10-CM | POA: Diagnosis not present

## 2021-04-19 DIAGNOSIS — Z4889 Encounter for other specified surgical aftercare: Secondary | ICD-10-CM | POA: Diagnosis not present

## 2021-04-19 DIAGNOSIS — M5459 Other low back pain: Secondary | ICD-10-CM | POA: Diagnosis not present

## 2021-04-24 DIAGNOSIS — M5459 Other low back pain: Secondary | ICD-10-CM | POA: Diagnosis not present

## 2021-04-24 DIAGNOSIS — Z4889 Encounter for other specified surgical aftercare: Secondary | ICD-10-CM | POA: Diagnosis not present

## 2021-04-26 DIAGNOSIS — M5459 Other low back pain: Secondary | ICD-10-CM | POA: Diagnosis not present

## 2021-04-26 DIAGNOSIS — Z4889 Encounter for other specified surgical aftercare: Secondary | ICD-10-CM | POA: Diagnosis not present

## 2021-08-23 DIAGNOSIS — M5417 Radiculopathy, lumbosacral region: Secondary | ICD-10-CM | POA: Diagnosis not present

## 2021-08-23 DIAGNOSIS — M545 Low back pain, unspecified: Secondary | ICD-10-CM | POA: Diagnosis not present

## 2021-11-09 ENCOUNTER — Ambulatory Visit: Payer: Self-pay | Admitting: Orthopedic Surgery

## 2021-11-09 DIAGNOSIS — M5126 Other intervertebral disc displacement, lumbar region: Secondary | ICD-10-CM

## 2021-11-13 ENCOUNTER — Ambulatory Visit: Payer: Self-pay | Admitting: Orthopedic Surgery

## 2021-11-13 NOTE — H&P (Signed)
Derrick [name] is an 41 y.o. male.   Chief Complaint: back and left leg pain, numbness, weakness HPI: For associated symptoms, patient reports numbness/tingling (left calf and left foot). For reason for visit, patient reports (normal) review of test results (lumbar mri). Follows up today accompanied by his wife. He is back to review his MRI. Since I last saw him he has also been experiencing numbness and tingling in the left foot, feels as though the weakness is worse and the numbness is getting worse, he is also experiencing some right buttock pain.  He is now 9 months out from decompression at L5-S1.  Past Medical History:  Diagnosis Date   Angio-edema    Chest pain    a. 06/16/15: normal stress echo   GERD (gastroesophageal reflux disease)    H/O fracture of nose    MRSA infection    on legs,    Tobacco abuse    Urticaria     Past Surgical History:  Procedure Laterality Date   ESOPHAGOGASTRODUODENOSCOPY N/A 04/15/2015   Procedure: ESOPHAGOGASTRODUODENOSCOPY (EGD);  Surgeon: Malissa Hippo, MD;  Location: [facility];  Service: Endoscopy;  Laterality: N/A;  1055 - moved to 12:10 - Ann notified pt   LUMBAR LAMINECTOMY/DECOMPRESSION MICRODISCECTOMY Left 02/16/2021   Procedure: Left Lumbar Five-Sacral One Microlumbar decompression;  Surgeon: Jene Every, MD;  Location: [facility] OR;  Service: Orthopedics;  Laterality: Left;   right ankle fracture     X 3   testicular tortion     UMBILICAL HERNIA REPAIR      No family history on file. Social History:  reports that he has been smoking cigarettes. He has a 10.00 pack-year smoking history. He has never used smokeless tobacco. He reports current alcohol use. He reports that he does not use drugs.  Allergies:  Allergies  Allergen Reactions   Alpha-Gal Hives and Itching    Sometimes red meat causes itching, hives, and stomach cramps.   Bee Venom Swelling    Current meds: clotrimazole-betamethasone 1 %-0.05 % topical cream predniSONE 5  mg tablets in a dose pack tadalafiL 5 mg tablet  Review of Systems  Constitutional: Negative.   HENT: Negative.    Eyes: Negative.   Respiratory: Negative.    Cardiovascular: Negative.   Gastrointestinal: Negative.   Endocrine: Negative.   Genitourinary: Negative.   Musculoskeletal:  Positive for back pain.  Skin: Negative.   Neurological:  Positive for weakness and numbness.  Psychiatric/Behavioral: Negative.      There were no vitals taken for this visit. Physical Exam Constitutional:      Appearance: Normal appearance.  HENT:     Head: Normocephalic and atraumatic.     Right Ear: External ear normal.     Left Ear: External ear normal.     Nose: Nose normal.     Mouth/Throat:     Pharynx: Oropharynx is clear.  Eyes:     Conjunctiva/sclera: Conjunctivae normal.  Cardiovascular:     Rate and Rhythm: Normal rate and regular rhythm.     Pulses: Normal pulses.     Heart sounds: Normal heart sounds.  Pulmonary:     Effort: Pulmonary effort is normal.     Breath sounds: Normal breath sounds.  Abdominal:     General: Bowel sounds are normal.  Musculoskeletal:     Cervical back: Normal range of motion.     Comments: Patient is awake, alert, and oriented 3. Well-nourished and well-developed. In distress due to pain. Seated but leaning  right, uncomfortable. Slightly antalgic gait.  On examination of the lumbar spine, nontender to palpation through the spinous processes, bilateral paraspinous musculature. Tender bilateral buttock. nontender over the hips. Decreased flexion and extension lumbar spine. Seated straight leg raise on the left reproduces buttock and leg pain, negative on the right. Trace weakness on the left with EHL and quad testing. No groin pain with rotation of the hips bilaterally. Patellar and Achilles reflexes 2+. No clonus present, negative Babinski. Decreased sensation lateral aspect of the left foot. No calf pain or sign of DVT.    Skin:    General: Skin is  warm and dry.  Neurological:     Mental Status: He is alert.    MRI images and report reviewed today, postop changes at L5-S1. There is a large recurrent left subarticular cranial disc extrusion posteriorly displacing the left S1 nerve roots.  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 Prior history of decompression 9 months ago at L5-S1 but with new work-related injury where he was kicked and fell backwards 2-1/2 months ago, progressively worsening symptoms in the last few weeks following physical assessment at work  Plan: We again discussed relevant anatomy, etiology of his symptoms, reviewed MRI images and report today. Dr. Shelle Iron has also already called the patient and discussed this with him last week. Given the size of this disc herniation and his exam with neural tension signs, weakness and numbness would recommend proceeding with revision decompression at L5-S1 on the left. We discussed the surgery itself as well as risks, complications and alternatives including but not limited to DVT, PE, failure of procedure, need for secondary procedure, anesthesia risk, even death. Discussed postop protocols, restrictions. In the interim he will continue with pain medication, we will work on getting this scheduled as soon as possible so as to avoid any permanent neurologic deficits. He will remain out of work in the interim. He was seen in conjunction with Dr. Shelle Iron today  Plan revision microdiscectomy L5-S1  Dorothy Spark, PA-C for Dr Shelle Iron 11/13/2021, 12:39 PM

## 2021-11-15 ENCOUNTER — Encounter (HOSPITAL_COMMUNITY): Payer: Self-pay | Admitting: Specialist

## 2021-11-15 NOTE — Progress Notes (Signed)
PCP - Dr Assunta Found Cardiologist - n/a  Chest x-ray - n/a EKG - 02/13/21 ECHO Stress Test - 06/16/15 ECHO - n/a Cardiac Cath - n/a  ICD Pacemaker/Loop - n/a  Sleep Study -  n/a CPAP - none  ERAS: Clear liquids til 4:30 AM DOS  Anesthesia review: Yes  STOP now taking any Aspirin (unless otherwise instructed by your surgeon), Aleve, Naproxen, Ibuprofen, Motrin, Advil, Goody's, BC's, all herbal medications, fish oil, and all vitamins.   Coronavirus Screening Do you have any of the following symptoms:  Cough yes/no: No Fever (>100.69F)  yes/no: No Runny nose yes/no: No Sore throat yes/no: No Difficulty breathing/shortness of breath  yes/no: No  Have you traveled in the last 14 days and where? [facility]  Patient verbalized understanding of instructions that were given via phone.

## 2021-11-15 NOTE — Anesthesia Preprocedure Evaluation (Addendum)
Anesthesia Evaluation  Patient identified by MRN, date of birth, ID band Patient awake    Reviewed: Allergy & Precautions, NPO status , Patient's Chart, lab work & pertinent test results  History of Anesthesia Complications Negative for: history of anesthetic complications  Airway Mallampati: II  TM Distance: >3 FB Neck ROM: Full    Dental  (+) Teeth Intact, Dental Advisory Given   Pulmonary Current Smoker and Patient abstained from smoking.,    Pulmonary exam normal        Cardiovascular hypertension, Normal cardiovascular exam     Neuro/Psych negative neurological ROS     GI/Hepatic Neg liver ROS, GERD  ,  Endo/Other  negative endocrine ROS  Renal/GU negative Renal ROS  negative genitourinary   Musculoskeletal negative musculoskeletal ROS (+)   Abdominal   Peds  Hematology negative hematology ROS (+)   Anesthesia Other Findings   Reproductive/Obstetrics                            Anesthesia Physical Anesthesia Plan  ASA: 2  Anesthesia Plan: General   Post-op Pain Management: Tylenol PO (pre-op)*   Induction: Intravenous  PONV Risk Score and Plan: 1 and Ondansetron, Dexamethasone, Treatment may vary due to age or medical condition and Midazolam  Airway Management Planned: Oral ETT  Additional Equipment: None  Intra-op Plan:   Post-operative Plan: Extubation in OR  Informed Consent: I have reviewed the patients History and Physical, chart, labs and discussed the procedure including the risks, benefits and alternatives for the proposed anesthesia with the patient or authorized representative who has indicated his/her understanding and acceptance.     Dental advisory given  Plan Discussed with:   Anesthesia Plan Comments:        Anesthesia Quick Evaluation

## 2021-11-16 ENCOUNTER — Encounter (HOSPITAL_COMMUNITY): Payer: Self-pay | Admitting: Specialist

## 2021-11-16 ENCOUNTER — Other Ambulatory Visit: Payer: Self-pay

## 2021-11-16 ENCOUNTER — Ambulatory Visit (HOSPITAL_COMMUNITY): Payer: BC Managed Care – PPO

## 2021-11-16 ENCOUNTER — Encounter (HOSPITAL_COMMUNITY)
Admission: RE | Disposition: A | Discharge status: Home / Self Care | Payer: Self-pay | Source: Home / Self Care | Attending: Specialist

## 2021-11-16 ENCOUNTER — Ambulatory Visit (HOSPITAL_COMMUNITY): Payer: BC Managed Care – PPO | Admitting: Anesthesiology

## 2021-11-16 ENCOUNTER — Ambulatory Visit (HOSPITAL_COMMUNITY)
Admission: RE | Admit: 2021-11-16 | Discharge: 2021-11-16 | Disposition: A | Discharge status: Home / Self Care | Payer: BC Managed Care – PPO | Attending: Specialist | Admitting: Specialist

## 2021-11-16 DIAGNOSIS — M4807 Spinal stenosis, lumbosacral region: Secondary | ICD-10-CM | POA: Diagnosis not present

## 2021-11-16 DIAGNOSIS — M5117 Intervertebral disc disorders with radiculopathy, lumbosacral region: Secondary | ICD-10-CM | POA: Insufficient documentation

## 2021-11-16 DIAGNOSIS — Z7952 Long term (current) use of systemic steroids: Secondary | ICD-10-CM | POA: Diagnosis not present

## 2021-11-16 DIAGNOSIS — Z981 Arthrodesis status: Secondary | ICD-10-CM | POA: Diagnosis not present

## 2021-11-16 DIAGNOSIS — M5116 Intervertebral disc disorders with radiculopathy, lumbar region: Secondary | ICD-10-CM | POA: Diagnosis not present

## 2021-11-16 DIAGNOSIS — M5127 Other intervertebral disc displacement, lumbosacral region: Secondary | ICD-10-CM | POA: Diagnosis not present

## 2021-11-16 DIAGNOSIS — Z01818 Encounter for other preprocedural examination: Secondary | ICD-10-CM | POA: Diagnosis not present

## 2021-11-16 DIAGNOSIS — M48061 Spinal stenosis, lumbar region without neurogenic claudication: Secondary | ICD-10-CM | POA: Diagnosis not present

## 2021-11-16 DIAGNOSIS — M5126 Other intervertebral disc displacement, lumbar region: Secondary | ICD-10-CM | POA: Diagnosis present

## 2021-11-16 HISTORY — DX: Essential (primary) hypertension: I10

## 2021-11-16 HISTORY — PX: LUMBAR LAMINECTOMY/DECOMPRESSION MICRODISCECTOMY: SHX5026

## 2021-11-16 HISTORY — DX: Thrombocytopenia, unspecified: D69.6

## 2021-11-16 LAB — CBC
HCT: 50.7 % (ref 39.0–52.0)
Hemoglobin: 16.7 g/dL (ref 13.0–17.0)
MCH: 30.4 pg (ref 26.0–34.0)
MCHC: 32.9 g/dL (ref 30.0–36.0)
MCV: 92.2 fL (ref 80.0–100.0)
Platelets: 80 10*3/uL — ABNORMAL LOW (ref 150–400)
RBC: 5.5 MIL/uL (ref 4.22–5.81)
RDW: 13.7 % (ref 11.5–15.5)
WBC: 9.9 10*3/uL (ref 4.0–10.5)
nRBC: 0 % (ref 0.0–0.2)

## 2021-11-16 LAB — BASIC METABOLIC PANEL
Anion gap: 11 (ref 5–15)
BUN: 20 mg/dL (ref 6–20)
CO2: 19 mmol/L — ABNORMAL LOW (ref 22–32)
Calcium: 8.6 mg/dL — ABNORMAL LOW (ref 8.9–10.3)
Chloride: 112 mmol/L — ABNORMAL HIGH (ref 98–111)
Creatinine, Ser: 1.31 mg/dL — ABNORMAL HIGH (ref 0.61–1.24)
GFR, Estimated: >60 mL/min (ref >60)
Glucose, Bld: 112 mg/dL — ABNORMAL HIGH (ref 70–99)
Potassium: 3.8 mmol/L (ref 3.5–5.1)
Sodium: 142 mmol/L (ref 135–145)

## 2021-11-16 LAB — SURGICAL PCR SCREEN
MRSA, PCR: NEGATIVE
Staphylococcus aureus: NEGATIVE

## 2021-11-16 SURGERY — LUMBAR LAMINECTOMY/DECOMPRESSION MICRODISCECTOMY 1 LEVEL
Anesthesia: General

## 2021-11-16 MED ORDER — OXYCODONE HCL 10 MG PO TABS: 10.0000 mg | ORAL_TABLET | ORAL | 0 refills | Status: AC | PRN

## 2021-11-16 MED ORDER — HYDROMORPHONE HCL 1 MG/ML IJ SOLN
INTRAMUSCULAR | Status: AC
  Filled 2021-11-16: qty 1

## 2021-11-16 MED ORDER — ONDANSETRON HCL 4 MG/2ML IJ SOLN
INTRAMUSCULAR | Status: DC | PRN
  Administered 2021-11-16: 4 mg via INTRAVENOUS

## 2021-11-16 MED ORDER — FENTANYL CITRATE (PF) 250 MCG/5ML IJ SOLN
INTRAMUSCULAR | Status: DC | PRN
  Administered 2021-11-16 (×3): 50 ug via INTRAVENOUS
  Administered 2021-11-16: 100 ug via INTRAVENOUS

## 2021-11-16 MED ORDER — HYDROMORPHONE HCL 1 MG/ML IJ SOLN
0.2500 mg | INTRAMUSCULAR | Status: DC | PRN
  Administered 2021-11-16 (×3): 0.5 mg via INTRAVENOUS

## 2021-11-16 MED ORDER — DEXAMETHASONE SODIUM PHOSPHATE 10 MG/ML IJ SOLN
INTRAMUSCULAR | Status: DC | PRN
  Administered 2021-11-16: 10 mg via INTRAVENOUS

## 2021-11-16 MED ORDER — PHENOL 1.4 % MT LIQD: 1.0000 | OROMUCOSAL | Status: DC | PRN

## 2021-11-16 MED ORDER — KCL IN DEXTROSE-NACL 20-5-0.45 MEQ/L-%-% IV SOLN: INTRAVENOUS | Status: DC

## 2021-11-16 MED ORDER — PREDNISONE 5 MG PO TABS
5.0000 mg | ORAL_TABLET | Freq: Two times a day (BID) | ORAL | Status: DC
  Filled 2021-11-16 (×2): qty 1

## 2021-11-16 MED ORDER — HYDROMORPHONE HCL 1 MG/ML IJ SOLN: 1.0000 mg | INTRAMUSCULAR | Status: DC | PRN

## 2021-11-16 MED ORDER — ONDANSETRON HCL 4 MG/2ML IJ SOLN: 4.0000 mg | Freq: Once | INTRAMUSCULAR | Status: DC | PRN

## 2021-11-16 MED ORDER — ONDANSETRON HCL 4 MG/2ML IJ SOLN: 4.0000 mg | Freq: Four times a day (QID) | INTRAMUSCULAR | Status: DC | PRN

## 2021-11-16 MED ORDER — DOCUSATE SODIUM 100 MG PO CAPS: 100.0000 mg | ORAL_CAPSULE | Freq: Two times a day (BID) | ORAL | Status: DC

## 2021-11-16 MED ORDER — PROPOFOL 10 MG/ML IV BOLUS
INTRAVENOUS | Status: DC | PRN
  Administered 2021-11-16: 200 mg via INTRAVENOUS
  Administered 2021-11-16: 50 mg via INTRAVENOUS

## 2021-11-16 MED ORDER — OXYCODONE HCL 5 MG PO TABS: 5.0000 mg | ORAL_TABLET | Freq: Once | ORAL | Status: DC | PRN

## 2021-11-16 MED ORDER — MENTHOL 3 MG MT LOZG: 1.0000 | LOZENGE | OROMUCOSAL | Status: DC | PRN

## 2021-11-16 MED ORDER — OXYCODONE HCL 5 MG/5ML PO SOLN: 5.0000 mg | Freq: Once | ORAL | Status: DC | PRN

## 2021-11-16 MED ORDER — ACETAMINOPHEN 325 MG PO TABS: 650.0000 mg | ORAL_TABLET | ORAL | Status: DC | PRN

## 2021-11-16 MED ORDER — CEFAZOLIN SODIUM-DEXTROSE 2-4 GM/100ML-% IV SOLN
2.0000 g | INTRAVENOUS | Status: AC
  Administered 2021-11-16: 2 g via INTRAVENOUS
  Filled 2021-11-16: qty 100

## 2021-11-16 MED ORDER — ALUM & MAG HYDROXIDE-SIMETH 200-200-20 MG/5ML PO SUSP: 30.0000 mL | Freq: Four times a day (QID) | ORAL | Status: DC | PRN

## 2021-11-16 MED ORDER — POLYETHYLENE GLYCOL 3350 17 G PO PACK: 17.0000 g | PACK | Freq: Every day | ORAL | 0 refills | Status: AC

## 2021-11-16 MED ORDER — BUPIVACAINE-EPINEPHRINE 0.5% -1:200000 IJ SOLN
INTRAMUSCULAR | Status: AC
  Filled 2021-11-16: qty 1

## 2021-11-16 MED ORDER — DOCUSATE SODIUM 100 MG PO CAPS: 100.0000 mg | ORAL_CAPSULE | Freq: Two times a day (BID) | ORAL | 1 refills | Status: AC | PRN

## 2021-11-16 MED ORDER — PROPOFOL 10 MG/ML IV BOLUS
INTRAVENOUS | Status: AC
  Filled 2021-11-16: qty 20

## 2021-11-16 MED ORDER — OXYCODONE HCL 5 MG PO TABS
10.0000 mg | ORAL_TABLET | ORAL | Status: DC | PRN
  Administered 2021-11-16: 10 mg via ORAL
  Filled 2021-11-16: qty 2

## 2021-11-16 MED ORDER — ORAL CARE MOUTH RINSE: 15.0000 mL | Freq: Once | OROMUCOSAL | Status: AC

## 2021-11-16 MED ORDER — CALCIUM CARBONATE ANTACID 500 MG PO CHEW
2.0000 | CHEWABLE_TABLET | Freq: Two times a day (BID) | ORAL | Status: DC | PRN
Start: 2021-11-16 — End: 2021-11-16

## 2021-11-16 MED ORDER — CEFAZOLIN SODIUM-DEXTROSE 2-4 GM/100ML-% IV SOLN
2.0000 g | Freq: Three times a day (TID) | INTRAVENOUS | Status: AC
  Administered 2021-11-16: 2 g via INTRAVENOUS
  Filled 2021-11-16: qty 100

## 2021-11-16 MED ORDER — CHLORHEXIDINE GLUCONATE 0.12 % MT SOLN
15.0000 mL | Freq: Once | OROMUCOSAL | Status: AC
  Administered 2021-11-16: 15 mL via OROMUCOSAL
  Filled 2021-11-16: qty 15

## 2021-11-16 MED ORDER — POLYETHYLENE GLYCOL 3350 17 G PO PACK: 17.0000 g | PACK | Freq: Every day | ORAL | Status: DC | PRN

## 2021-11-16 MED ORDER — SUGAMMADEX SODIUM 200 MG/2ML IV SOLN
INTRAVENOUS | Status: DC | PRN
  Administered 2021-11-16: 400 mg via INTRAVENOUS

## 2021-11-16 MED ORDER — MIDAZOLAM HCL 2 MG/2ML IJ SOLN
INTRAMUSCULAR | Status: DC | PRN
  Administered 2021-11-16: 2 mg via INTRAVENOUS

## 2021-11-16 MED ORDER — ACETAMINOPHEN 10 MG/ML IV SOLN
1000.0000 mg | INTRAVENOUS | Status: AC
  Administered 2021-11-16: 1000 mg via INTRAVENOUS
  Filled 2021-11-16: qty 100

## 2021-11-16 MED ORDER — RISAQUAD PO CAPS
1.0000 | ORAL_CAPSULE | Freq: Every day | ORAL | Status: DC
  Administered 2021-11-16: 1 via ORAL
  Filled 2021-11-16: qty 1

## 2021-11-16 MED ORDER — BUPIVACAINE-EPINEPHRINE 0.5% -1:200000 IJ SOLN
INTRAMUSCULAR | Status: DC | PRN
  Administered 2021-11-16: 5 mL

## 2021-11-16 MED ORDER — ACETAMINOPHEN 650 MG RE SUPP: 650.0000 mg | RECTAL | Status: DC | PRN

## 2021-11-16 MED ORDER — THROMBIN (RECOMBINANT) 20000 UNITS EX SOLR
CUTANEOUS | Status: AC
  Filled 2021-11-16: qty 20000

## 2021-11-16 MED ORDER — ROCURONIUM BROMIDE 10 MG/ML (PF) SYRINGE
PREFILLED_SYRINGE | INTRAVENOUS | Status: DC | PRN
  Administered 2021-11-16: 20 mg via INTRAVENOUS
  Administered 2021-11-16: 70 mg via INTRAVENOUS

## 2021-11-16 MED ORDER — LACTATED RINGERS IV SOLN: INTRAVENOUS | Status: DC

## 2021-11-16 MED ORDER — THROMBIN (RECOMBINANT) 20000 UNITS EX SOLR
CUTANEOUS | Status: DC | PRN
  Administered 2021-11-16: 20000 [IU] via TOPICAL

## 2021-11-16 MED ORDER — MIDAZOLAM HCL 2 MG/2ML IJ SOLN
INTRAMUSCULAR | Status: AC
Start: 2021-11-16 — End: ?
  Filled 2021-11-16: qty 2

## 2021-11-16 MED ORDER — DOCUSATE SODIUM 100 MG PO CAPS: 100.0000 mg | ORAL_CAPSULE | Freq: Two times a day (BID) | ORAL | Status: DC | PRN

## 2021-11-16 MED ORDER — FENTANYL CITRATE (PF) 250 MCG/5ML IJ SOLN
INTRAMUSCULAR | Status: AC
  Filled 2021-11-16: qty 5

## 2021-11-16 MED ORDER — OXYCODONE HCL 5 MG PO TABS: 5.0000 mg | ORAL_TABLET | ORAL | Status: DC | PRN

## 2021-11-16 MED ORDER — ONDANSETRON HCL 4 MG PO TABS: 4.0000 mg | ORAL_TABLET | Freq: Four times a day (QID) | ORAL | Status: DC | PRN

## 2021-11-16 MED ORDER — TRANEXAMIC ACID 1000 MG/10ML IV SOLN
INTRAVENOUS | Status: DC | PRN
  Administered 2021-11-16: 2000 mg via TOPICAL

## 2021-11-16 MED ORDER — HEMOSTATIC AGENTS (NO CHARGE) OPTIME
TOPICAL | Status: DC | PRN
  Administered 2021-11-16: 1 via TOPICAL

## 2021-11-16 MED ORDER — ACETAMINOPHEN 500 MG PO TABS
1000.0000 mg | ORAL_TABLET | Freq: Once | ORAL | Status: DC
  Filled 2021-11-16: qty 2

## 2021-11-16 MED ORDER — BISACODYL 5 MG PO TBEC: 5.0000 mg | DELAYED_RELEASE_TABLET | Freq: Every day | ORAL | Status: DC | PRN

## 2021-11-16 MED ORDER — TRANEXAMIC ACID 1000 MG/10ML IV SOLN
2000.0000 mg | Freq: Once | INTRAVENOUS | Status: DC
  Filled 2021-11-16: qty 20

## 2021-11-16 MED ORDER — AMISULPRIDE (ANTIEMETIC) 5 MG/2ML IV SOLN: 10.0000 mg | Freq: Once | INTRAVENOUS | Status: DC | PRN

## 2021-11-16 MED ORDER — TRANEXAMIC ACID-NACL 1000-0.7 MG/100ML-% IV SOLN
1000.0000 mg | INTRAVENOUS | Status: AC
  Administered 2021-11-16: 1000 mg via INTRAVENOUS
  Filled 2021-11-16: qty 100

## 2021-11-16 MED ORDER — METHOCARBAMOL 1000 MG/10ML IJ SOLN
500.0000 mg | Freq: Four times a day (QID) | INTRAVENOUS | Status: DC | PRN
  Filled 2021-11-16: qty 5

## 2021-11-16 MED ORDER — METHOCARBAMOL 500 MG PO TABS
500.0000 mg | ORAL_TABLET | Freq: Four times a day (QID) | ORAL | Status: DC | PRN
  Administered 2021-11-16: 500 mg via ORAL
  Filled 2021-11-16: qty 1

## 2021-11-16 MED ORDER — PANTOPRAZOLE SODIUM 40 MG PO TBEC
40.0000 mg | DELAYED_RELEASE_TABLET | Freq: Every day | ORAL | Status: DC
  Administered 2021-11-16: 40 mg via ORAL
  Filled 2021-11-16: qty 1

## 2021-11-16 MED ORDER — LIDOCAINE 2% (20 MG/ML) 5 ML SYRINGE
INTRAMUSCULAR | Status: DC | PRN
  Administered 2021-11-16: 100 mg via INTRAVENOUS

## 2021-11-16 MED ORDER — 0.9 % SODIUM CHLORIDE (POUR BTL) OPTIME
TOPICAL | Status: DC | PRN
  Administered 2021-11-16: 1000 mL

## 2021-11-16 MED ORDER — ROCURONIUM BROMIDE 10 MG/ML (PF) SYRINGE
PREFILLED_SYRINGE | INTRAVENOUS | Status: AC
  Filled 2021-11-16: qty 20

## 2021-11-16 SURGICAL SUPPLY — 67 items
BAG COUNTER SPONGE SURGICOUNT (BAG) ×3 IMPLANT
BAG DECANTER FOR FLEXI CONT (MISCELLANEOUS) IMPLANT
BAND RUBBER #18 3X1/16 STRL (MISCELLANEOUS) ×4 IMPLANT
BUR EGG ELITE 5.0 (BURR) IMPLANT
BUR RND DIAMOND ELITE 4.0 (BURR) ×1 IMPLANT
BUR STRYKR EGG 5.0 (BURR) IMPLANT
CABLE BIPOLOR RESECTION CORD (MISCELLANEOUS) ×1 IMPLANT
CARTRIDGE OIL MAESTRO DRILL (MISCELLANEOUS) IMPLANT
CLEANER TIP ELECTROSURG 2X2 (MISCELLANEOUS) ×2 IMPLANT
CNTNR URN SCR LID CUP LEK RST (MISCELLANEOUS) ×1 IMPLANT
CONT SPEC 4OZ STRL OR WHT (MISCELLANEOUS) ×4
DIFFUSER DRILL AIR PNEUMATIC (MISCELLANEOUS) ×1 IMPLANT
DRAPE LAPAROTOMY 100X72X124 (DRAPES) ×2 IMPLANT
DRAPE MICROSCOPE LEICA (MISCELLANEOUS) ×2 IMPLANT
DRAPE SHEET LG 3/4 BI-LAMINATE (DRAPES) ×2 IMPLANT
DRAPE SURG 17X11 SM STRL (DRAPES) ×2 IMPLANT
DRAPE UTILITY XL STRL (DRAPES) ×2 IMPLANT
DRESSING AQUACEL AG SP 3.5X4 (GAUZE/BANDAGES/DRESSINGS) IMPLANT
DRSG AQUACEL AG ADV 3.5X 4 (GAUZE/BANDAGES/DRESSINGS) IMPLANT
DRSG AQUACEL AG ADV 3.5X 6 (GAUZE/BANDAGES/DRESSINGS) IMPLANT
DRSG AQUACEL AG SP 3.5X4 (GAUZE/BANDAGES/DRESSINGS) ×2
DRSG TELFA 3X8 NADH (GAUZE/BANDAGES/DRESSINGS) IMPLANT
DURAPREP 26ML APPLICATOR (WOUND CARE) ×2 IMPLANT
DURASEAL SPINE SEALANT 3ML (MISCELLANEOUS) IMPLANT
ELECT BLADE 4.0 EZ CLEAN MEGAD (MISCELLANEOUS)
ELECT REM PT RETURN 9FT ADLT (ELECTROSURGICAL) ×2
ELECTRODE BLDE 4.0 EZ CLN MEGD (MISCELLANEOUS) IMPLANT
ELECTRODE REM PT RTRN 9FT ADLT (ELECTROSURGICAL) ×1 IMPLANT
GLOVE BIOGEL PI IND STRL 7.5 (GLOVE) ×1 IMPLANT
GLOVE BIOGEL PI INDICATOR 7.5 (GLOVE) ×2
GLOVE SS BIOGEL STRL SZ 7 (GLOVE) ×1 IMPLANT
GLOVE SS BIOGEL STRL SZ 8 (GLOVE) ×2 IMPLANT
GLOVE SUPERSENSE BIOGEL SZ 7 (GLOVE) ×1
GLOVE SUPERSENSE BIOGEL SZ 8 (GLOVE) ×2
GLOVE SURG SS PI 6.5 STRL IVOR (GLOVE) ×2 IMPLANT
GLOVE SURG SS PI 7.0 STRL IVOR (GLOVE) ×2 IMPLANT
GOWN STRL REUS W/ TWL LRG LVL3 (GOWN DISPOSABLE) ×1 IMPLANT
GOWN STRL REUS W/ TWL XL LVL3 (GOWN DISPOSABLE) ×1 IMPLANT
GOWN STRL REUS W/TWL LRG LVL3 (GOWN DISPOSABLE) ×4
GOWN STRL REUS W/TWL XL LVL3 (GOWN DISPOSABLE) ×4
IV CATH 14GX2 1/4 (CATHETERS) ×2 IMPLANT
KIT BASIN OR (CUSTOM PROCEDURE TRAY) ×2 IMPLANT
NDL SPNL 18GX3.5 QUINCKE PK (NEEDLE) ×2 IMPLANT
NEEDLE 22X1 1/2 (OR ONLY) (NEEDLE) ×2 IMPLANT
NEEDLE SPNL 18GX3.5 QUINCKE PK (NEEDLE) ×4 IMPLANT
OIL CARTRIDGE MAESTRO DRILL (MISCELLANEOUS) ×2
PACK LAMINECTOMY NEURO (CUSTOM PROCEDURE TRAY) ×2 IMPLANT
PAD DRESSING TELFA 3X8 NADH (GAUZE/BANDAGES/DRESSINGS) IMPLANT
PATTIES SURGICAL .75X.75 (GAUZE/BANDAGES/DRESSINGS) ×2 IMPLANT
SPONGE SURGIFOAM ABS GEL 100 (HEMOSTASIS) ×2 IMPLANT
SPONGE T-LAP 4X18 ~~LOC~~+RFID (SPONGE) IMPLANT
STAPLER VISISTAT (STAPLE) IMPLANT
STRIP CLOSURE SKIN 1/2X4 (GAUZE/BANDAGES/DRESSINGS) ×1 IMPLANT
SUT NURALON 4 0 TR CR/8 (SUTURE) IMPLANT
SUT PROLENE 3 0 PS 2 (SUTURE) ×1 IMPLANT
SUT VIC AB 1 CT1 27 (SUTURE)
SUT VIC AB 1 CT1 27XBRD ANTBC (SUTURE) IMPLANT
SUT VIC AB 1-0 CT2 27 (SUTURE) ×1 IMPLANT
SUT VIC AB 2-0 CT1 27 (SUTURE)
SUT VIC AB 2-0 CT1 TAPERPNT 27 (SUTURE) IMPLANT
SUT VIC AB 2-0 CT2 27 (SUTURE) ×1 IMPLANT
SYR 3ML LL SCALE MARK (SYRINGE) ×2 IMPLANT
TOWEL GREEN STERILE (TOWEL DISPOSABLE) ×2 IMPLANT
TOWEL GREEN STERILE FF (TOWEL DISPOSABLE) ×2 IMPLANT
TRAY FOLEY MTR SLVR 16FR STAT (SET/KITS/TRAYS/PACK) ×1 IMPLANT
WIPE CHG CHLORHEXIDINE 2% (PERSONAL CARE ITEMS) ×3 IMPLANT
YANKAUER SUCT BULB TIP NO VENT (SUCTIONS) ×2 IMPLANT

## 2021-11-16 NOTE — Interval H&P Note (Signed)
History and Physical Interval Note:  11/16/2021 7:24 AM  Derrick [name]  has presented today for surgery, with the diagnosis of Recurrent herniated disc L5-S1.  The various methods of treatment have been discussed with the patient and family. After consideration of risks, benefits and other options for treatment, the patient has consented to  Procedure(s): Revision microdiscectomy L5-S1 (N/A) as a surgical intervention.  The patient's history has been reviewed, patient examined, no change in status, stable for surgery.  I have reviewed the patient's chart and labs.  Questions were answered to the patient's satisfaction.     Derrick [name]  Patient with platelets of 80.  This is up from his previous procedure.  He has had lifelong.  Thrombocytopenia.  He has had no easy bleeding.  Straight leg raise is positive.  He has weakness in dorsiflexion and plantarflexion 4/5.  He has diminished sensation on the dorsum lateral and plantar aspect of the foot.

## 2021-11-16 NOTE — Anesthesia Procedure Notes (Signed)
Procedure Name: Intubation Date/Time: 11/16/2021 7:59 AM  Performed by: Lavell Luster, CRNAPre-anesthesia Checklist: Patient identified, Emergency Drugs available, Suction available, Patient being monitored and Timeout performed Patient Re-evaluated:Patient Re-evaluated prior to induction Oxygen Delivery Method: Circle system utilized Preoxygenation: Pre-oxygenation with 100% oxygen Induction Type: IV induction Ventilation: Mask ventilation without difficulty Laryngoscope Size: Mac and 4 Grade View: Grade I Tube type: Oral Tube size: 7.5 mm Number of attempts: 1 Airway Equipment and Method: Stylet Placement Confirmation: ETT inserted through vocal cords under direct vision, positive ETCO2 and breath sounds checked- equal and bilateral Secured at: 21 cm Tube secured with: Tape Dental Injury: Teeth and Oropharynx as per pre-operative assessment

## 2021-11-16 NOTE — Progress Notes (Signed)
Patient walked out of the unit with his wife who works here as NT to their vehicle for discharge home; was medicated recently with complaints of moderate pain on his lower back; incision on his back with hydrocolloid dressing and is clean, dry and intact; moves all extremities well; room was checked for all his belongings; discharge instructions concerning his medications, incision care, follow up appointment and when to call the doctor as needed were all discussed with patient and his wife by RN and both expressed understanding on the instructions given.

## 2021-11-16 NOTE — Brief Op Note (Signed)
11/16/2021  9:46 AM  PATIENT:  [name]  41 y.o. male  PRE-OPERATIVE DIAGNOSIS:  Recurrent herniated disc Lumbar five sacral one  POST-OPERATIVE DIAGNOSIS:  Recurrent herniated disc Lumbar five sacral one  PROCEDURE:  Procedure(s): Revision microdiscectomy Lumbar five-Sacral one (N/A)  SURGEON:  Surgeon(s) and Role:    Jene Every, MD - Primary  PHYSICIAN ASSISTANT:   ASSISTANTS: Bissell   ANESTHESIA:   general  EBL:  min   BLOOD ADMINISTERED:none  DRAINS: none   LOCAL MEDICATIONS USED:  MARCAINE     SPECIMEN:  No Specimen  DISPOSITION OF SPECIMEN:  N/A  COUNTS:  YES  TOURNIQUET:  * No tourniquets in log *  DICTATION: .Other Dictation: Dictation Number [id number]  PLAN OF CARE: Admit for overnight observation  PATIENT DISPOSITION:  PACU - hemodynamically stable.   Delay start of Pharmacological VTE agent (>24hrs) due to surgical blood loss or risk of bleeding: yes

## 2021-11-16 NOTE — Op Note (Unsigned)
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  POSTOPERATIVE DIAGNOSES:  Recurrent disk herniation L5-S1 and spinal stenosis L5-S1, left.  PROCEDURE PERFORMED:  1.  Revision microlumbar decompression L5-S1, left. 2.  Foraminotomy L5-S1, left. 3.  Microdiskectomy L5-S1, left.  ANESTHESIA:  General.  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  Had an injury where he sustained recurrent disk herniation, that was large, compressing the S1 nerve root and the L5 root.  He had dorsiflexion weakness,  plantarflexion weakness, numbness in L5-S1 nerve root distribution.  Given the progressive neurologic deficit, he was indicated for microlumbar decompression at L5-S1.  Risks and benefits discussed including bleeding, infection, damage to neurovascular  structures, no change in symptoms, worsening symptoms, need for repeat decompression, DVT, PE, anesthetic complications, need for fusion in the future, etc.  DESCRIPTION OF PROCEDURE:  With the patient in supine position, after induction of adequate general anesthesia, 2 grams Kefzol, he was placed prone on the Wilson frame.  All bony prominences were well padded.  Lumbar region was prepped and draped in the  usual sterile fashion.  Two 18-gauge spinal needles utilized to localize L5-S1 interspace.  This was confirmed with x-ray.  Incision was made from the spinous process above L5 to below S1.  Subcutaneous tissue was dissected.  Electrocautery was utilized  to achieve hemostasis.  Dorsal lumbar fascia divided in line with skin incision, paraspinous musculature and fibrosis was elevated and the previous laminotomy  defect was skeletonized.  Operating microscope was draped and brought in the surgical field.   Utilized a micro curette to develop plane between the epidural fibrosis and the previous laminotomy.  Following this, I used a high-speed bur to enlarge the laminotomy cephalad as the disk herniation was noted to migrate cephalad.  This was approximately  5 mm additional cephalad.  Approximately 2 mm lateral.  We had ample pars remaining.  Following this, I used a 2 and 3 mm Kerrison to the point of detaching the previous ligamentum from the caudad edge of L5.  I identified the cephalad edge of S1 as  well.  Residual ligamentum flavum was mobilized with a Woodson and then removed.  I gently mobilized the epidural fat medially and immediately seen was this large free fragment.  This was lateral to the thecal sac.  Developed a plane between that and the  thecal sac and the epidural tissue and irrigated it.  This was gently mobilized with a combination of a micropituitary nerve hook and gentle translation.  A large fragment was then removed.  There were epidural venous plexus noted beneath that. Two  additional large fragments were subsequently mobilized as well.  One that extended out the foramen of L5 and one cephalad beneath the thecal sac.  Both were removed.  Identified the L5 foramen.  I developed plane between the epidural fibrosis and the  superior articulating process of S1.  Then used a micro curette to develop a plane between the previous epidural fibrosis.  I looked caudad and then identified the disk space.  Gently mobilizing tissue medially.  Woodson retractor passed freely out the  foramen of L5.  Epidural venous plexus was noted on the root of L5 and  extending out the foramen. Used bipolar cautery to achieve hemostasis in a portion of the more dorsal venous plexus.  I then mobilized caudad, entered the disk space and additional  fragments were retrieved.  This was lavaged with catheter lavage and  additional fragments were retrieved with  micropituitary.  Woodson probe passed freely out the foramen of L5 and S1.  There was 1 cm of excursion of the S1 nerve root medial to pedicle  without tension.  I checked below the disk space, above the disk space, out the foramen of L5 in the lateral recess, the axilla of the L5 root and the S1 nerve root, there was no residual disk herniation that was noted.  Following this, I copiously  irrigated.  I used thrombin-soaked Gelfoam throughout the case and then retrieved the patties.  We used TXA-soaked patties and they were retrieved.  We achieved strict hemostasis.  Good restoration of the thecal sac.  No evidence of CSF leakage or active  bleeding.  After confirmatory radiograph obtained confirming the foramen of L5 above the disk space, removed the McCulloch retractor, irrigated the paraspinous musculature.  Any minor bleeding was cauterized.  I then reapproximated the dorsal lumbar  fascia with 1 Vicryl in interrupted figure-of-eight sutures, leaving a small aperture caudad for any bleeding that may occur, subcutaneous with 2-0 and the skin was then utilized with staples, again with a small aperture distally.  Wound was dressed  sterilely, placed supine on the hospital bed, extubated without difficulty and transported to the recovery room in satisfactory condition.  The patient tolerated the procedure well.  No complications.  Assistant Andrez Grime, [place].  Blood loss, approximately 20 mL. PA was utilized throughout the case for gentle intermittent neuro retraction, suction, patient positioning and closure.   SHW D: 11/16/2021 9:54:13 am T: 11/16/2021 11:13:00 am  JOB: [id number]/ [id number]

## 2021-11-16 NOTE — Discharge Instructions (Signed)

## 2021-11-16 NOTE — Anesthesia Postprocedure Evaluation (Signed)
Anesthesia Post Note  Patient: Derrick [name]  Procedure(s) Performed: Revision microdiscectomy Lumbar five-Sacral one     Patient location during evaluation: PACU Anesthesia Type: General Level of consciousness: awake and alert Pain management: pain level controlled Vital Signs Assessment: post-procedure vital signs reviewed and stable Respiratory status: spontaneous breathing, nonlabored ventilation and respiratory function stable Cardiovascular status: blood pressure returned to baseline and stable Postop Assessment: no apparent nausea or vomiting Anesthetic complications: no   No notable events documented.  Last Vitals:  Vitals:   11/16/21 1020 11/16/21 1035  BP: (!) 137/91 (!) 153/94  Pulse: (!) 52 (!) 47  Resp: 18 18  Temp: (!) 36.4 C 36.6 C  SpO2: 99% 97%    Last Pain:  Vitals:   11/16/21 1035  TempSrc:   PainSc: 3                  [name]

## 2021-11-16 NOTE — Progress Notes (Signed)
Dr.Beane aware that pt's platelets are 80 on CBC today.

## 2021-11-16 NOTE — Transfer of Care (Signed)
Immediate Anesthesia Transfer of Care Note  Patient: Derrick [name]  Procedure(s) Performed: Revision microdiscectomy Lumbar five-Sacral one  Patient Location: PACU  Anesthesia Type:General  Level of Consciousness: awake and alert   Airway & Oxygen Therapy: Patient Spontanous Breathing  Post-op Assessment: Report given to RN and Post -op Vital signs reviewed and stable  Post vital signs: Reviewed and stable  Last Vitals:  Vitals Value Taken Time  BP 146/98 11/16/21 0950  Temp 36.2 C 11/16/21 0950  Pulse 59 11/16/21 0957  Resp 18 11/16/21 0957  SpO2 99 % 11/16/21 0957  Vitals shown include unvalidated device data.  Last Pain:  Vitals:   11/16/21 0950  TempSrc:   PainSc: 6       Patients Stated Pain Goal: 2 (11/16/21 0559)  Complications: No notable events documented.

## 2021-11-16 NOTE — Evaluation (Signed)
Occupational Therapy Evaluation Patient Details Name: Derrick [name] MRN: [id number] DOB: 09/08/1980 Today's Date: 11/16/2021   History of Present Illness 41 yo M adm for Revision microdiscectomy Lumbar five-Sacral one 6/22.  PMH unremarkable.   Clinical Impression   Patient admitted for the diagnosis above.  PTA he lives with his family, and continues to remain very active, needing no assist with any aspect of ADL/iADL, or mobility.  Patient has the expected level of discomfort post surgery, but will do very well, with no needs in the acute setting.  Patient has a good understanding of all precautions, and will have any needed assist from his spouse.  Recommend follow up with MD as prescribed.        Recommendations for follow up therapy are one component of a multi-disciplinary discharge planning process, led by the attending physician.  Recommendations may be updated based on patient status, additional functional criteria and insurance authorization.   Follow Up Recommendations  No OT follow up    Assistance Recommended at Discharge PRN  Patient can return home with the following  None    Functional Status Assessment  Patient has not had a recent decline in their functional status  Equipment Recommendations  None recommended by OT    Recommendations for Other Services       Precautions / Restrictions Precautions Precautions: Back Precaution Booklet Issued: Yes (comment) Restrictions Weight Bearing Restrictions: No      Mobility Bed Mobility Overal bed mobility: Modified Independent                  Transfers Overall transfer level: Modified independent                        Balance Overall balance assessment: Independent                                         ADL either performed or assessed with clinical judgement   ADL Overall ADL's : At baseline                                             Vision  Baseline Vision/History: 0 No visual deficits       Perception Perception Perception: Not tested   Praxis Praxis Praxis: Not tested    Pertinent Vitals/Pain Pain Assessment Pain Assessment: Faces Faces Pain Scale: Hurts a little bit Pain Location: Incisional Pain Descriptors / Indicators: Tender Pain Intervention(s): Monitored during session     Hand Dominance Right   Extremity/Trunk Assessment Upper Extremity Assessment Upper Extremity Assessment: Overall WFL for tasks assessed   Lower Extremity Assessment Lower Extremity Assessment: Overall WFL for tasks assessed   Cervical / Trunk Assessment Cervical / Trunk Assessment: Back Surgery   Communication Communication Communication: No difficulties   Cognition Arousal/Alertness: Awake/alert Behavior During Therapy: WFL for tasks assessed/performed Overall Cognitive Status: Within Functional Limits for tasks assessed                                       General Comments   Vss on RA    Exercises     Shoulder Instructions      Home Living  Family/patient expects to be discharged to:: Private residence Living Arrangements: Spouse/significant other;Children Available Help at Discharge: Family;Available PRN/intermittently Type of Home: House Home Access: Stairs to enter     Home Layout: One level     Bathroom Shower/Tub: Tub/shower unit;Walk-in shower   Bathroom Toilet: Standard     Home Equipment: None          Prior Functioning/Environment Prior Level of Function : Independent/Modified Independent;Working/employed;Driving                        OT Problem List: Impaired sensation      OT Treatment/Interventions:      OT Goals(Current goals can be found in the care plan section) Acute Rehab OT Goals Patient Stated Goal: Return home today OT Goal Formulation: With patient Time For Goal Achievement: 11/20/21 Potential to Achieve Goals: Good  OT Frequency:       Co-evaluation              AM-PAC OT "6 Clicks" Daily Activity     Outcome Measure Help from another person eating meals?: None Help from another person taking care of personal grooming?: None Help from another person toileting, which includes using toliet, bedpan, or urinal?: None Help from another person bathing (including washing, rinsing, drying)?: None Help from another person to put on and taking off regular upper body clothing?: None Help from another person to put on and taking off regular lower body clothing?: None 6 Click Score: 24   End of Session Nurse Communication: Mobility status  Activity Tolerance: Patient tolerated treatment well Patient left: in bed;with call bell/phone within reach  OT Visit Diagnosis: Pain Pain - Right/Left: Left Pain - part of body: Leg                Time: 1856-3149 OT Time Calculation (min): 17 min Charges:  OT General Charges $OT Visit: 1 Visit OT Evaluation $OT Eval Moderate Complexity: 1 Mod  11/16/2021  RP, OTR/L  Acute Rehabilitation Services  Office:  [phone]   Suzanna Obey 11/16/2021, 2:13 PM

## 2021-11-17 ENCOUNTER — Encounter (HOSPITAL_COMMUNITY): Payer: Self-pay | Admitting: Specialist

## 2022-04-08 ENCOUNTER — Encounter (INDEPENDENT_AMBULATORY_CARE_PROVIDER_SITE_OTHER): Payer: Self-pay | Admitting: Gastroenterology

## 2022-11-01 IMAGING — CR DG LUMBAR SPINE 2-3V
2 series · 2 of 2 positions shown · non-contrast
Comparison: Lumbar spine radiograph dated November 16, 2021

CLINICAL DATA: Intraop imaging for localization

EXAM:
LUMBAR SPINE - 1 VIEW

[lateral (1 of 2)]
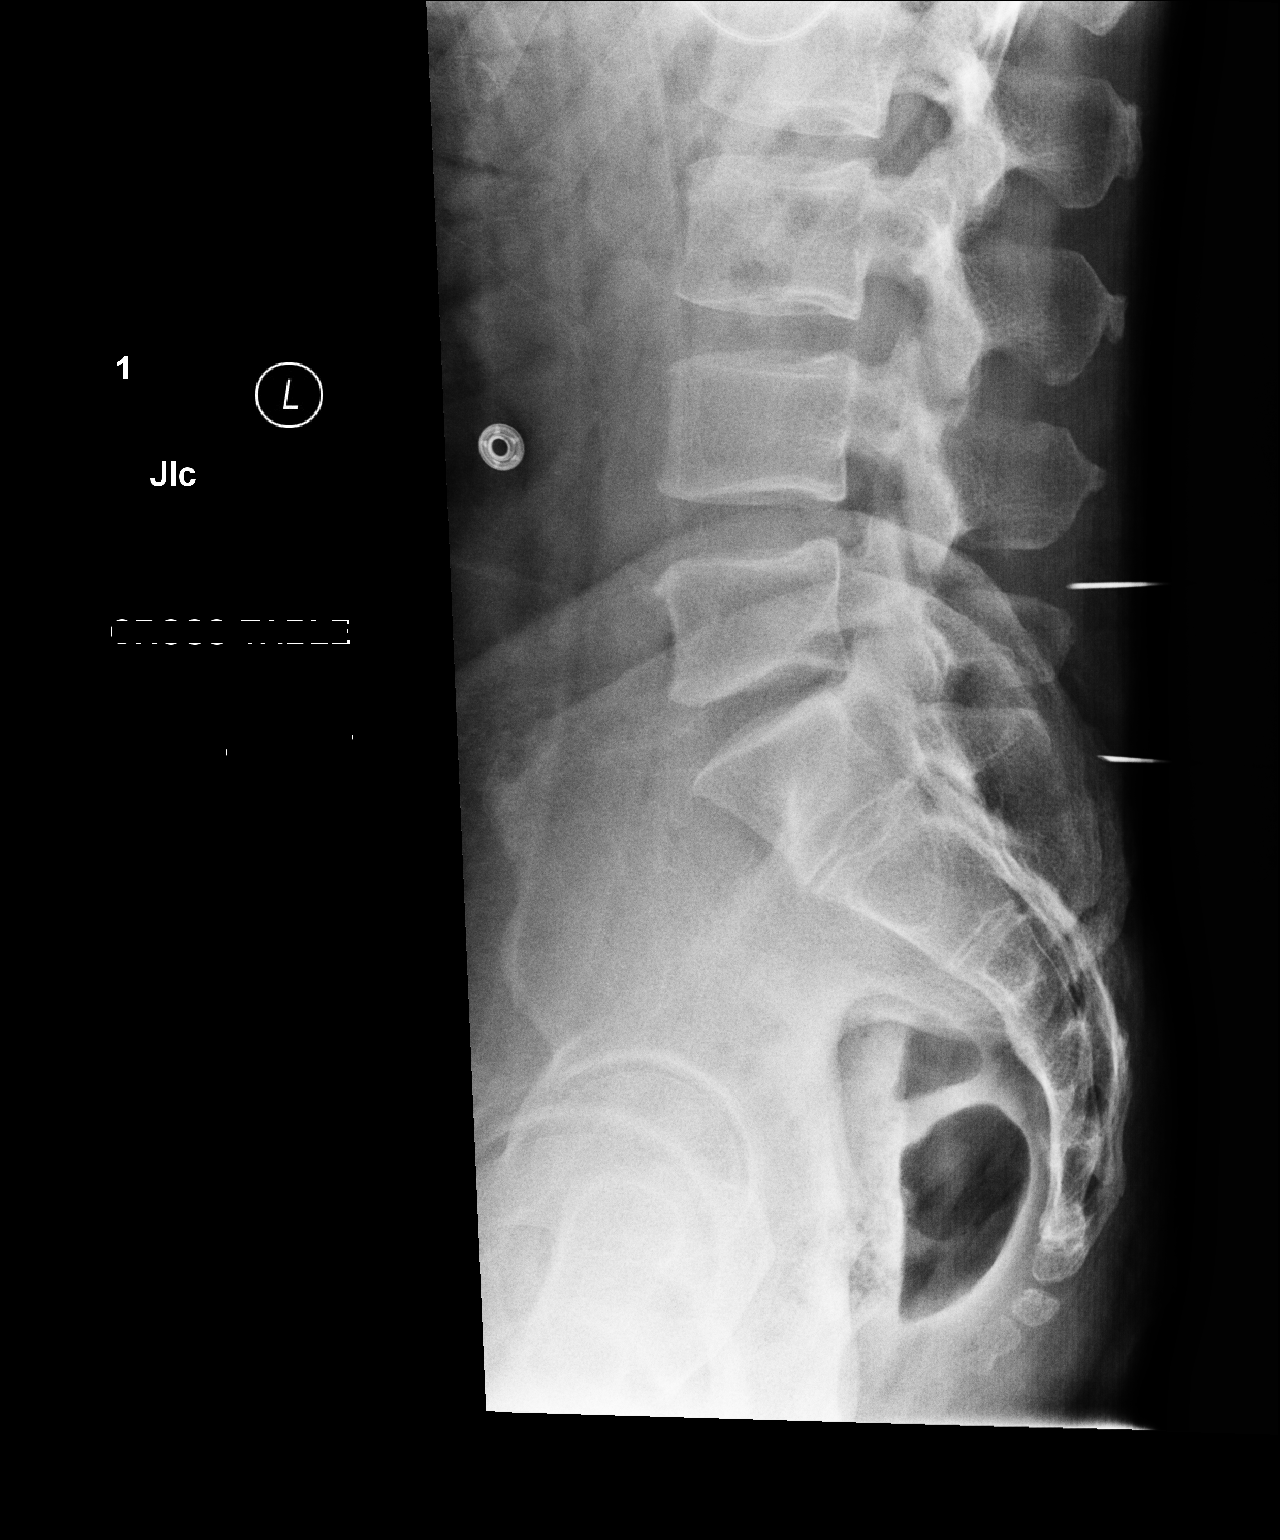

[lateral (2 of 2)]
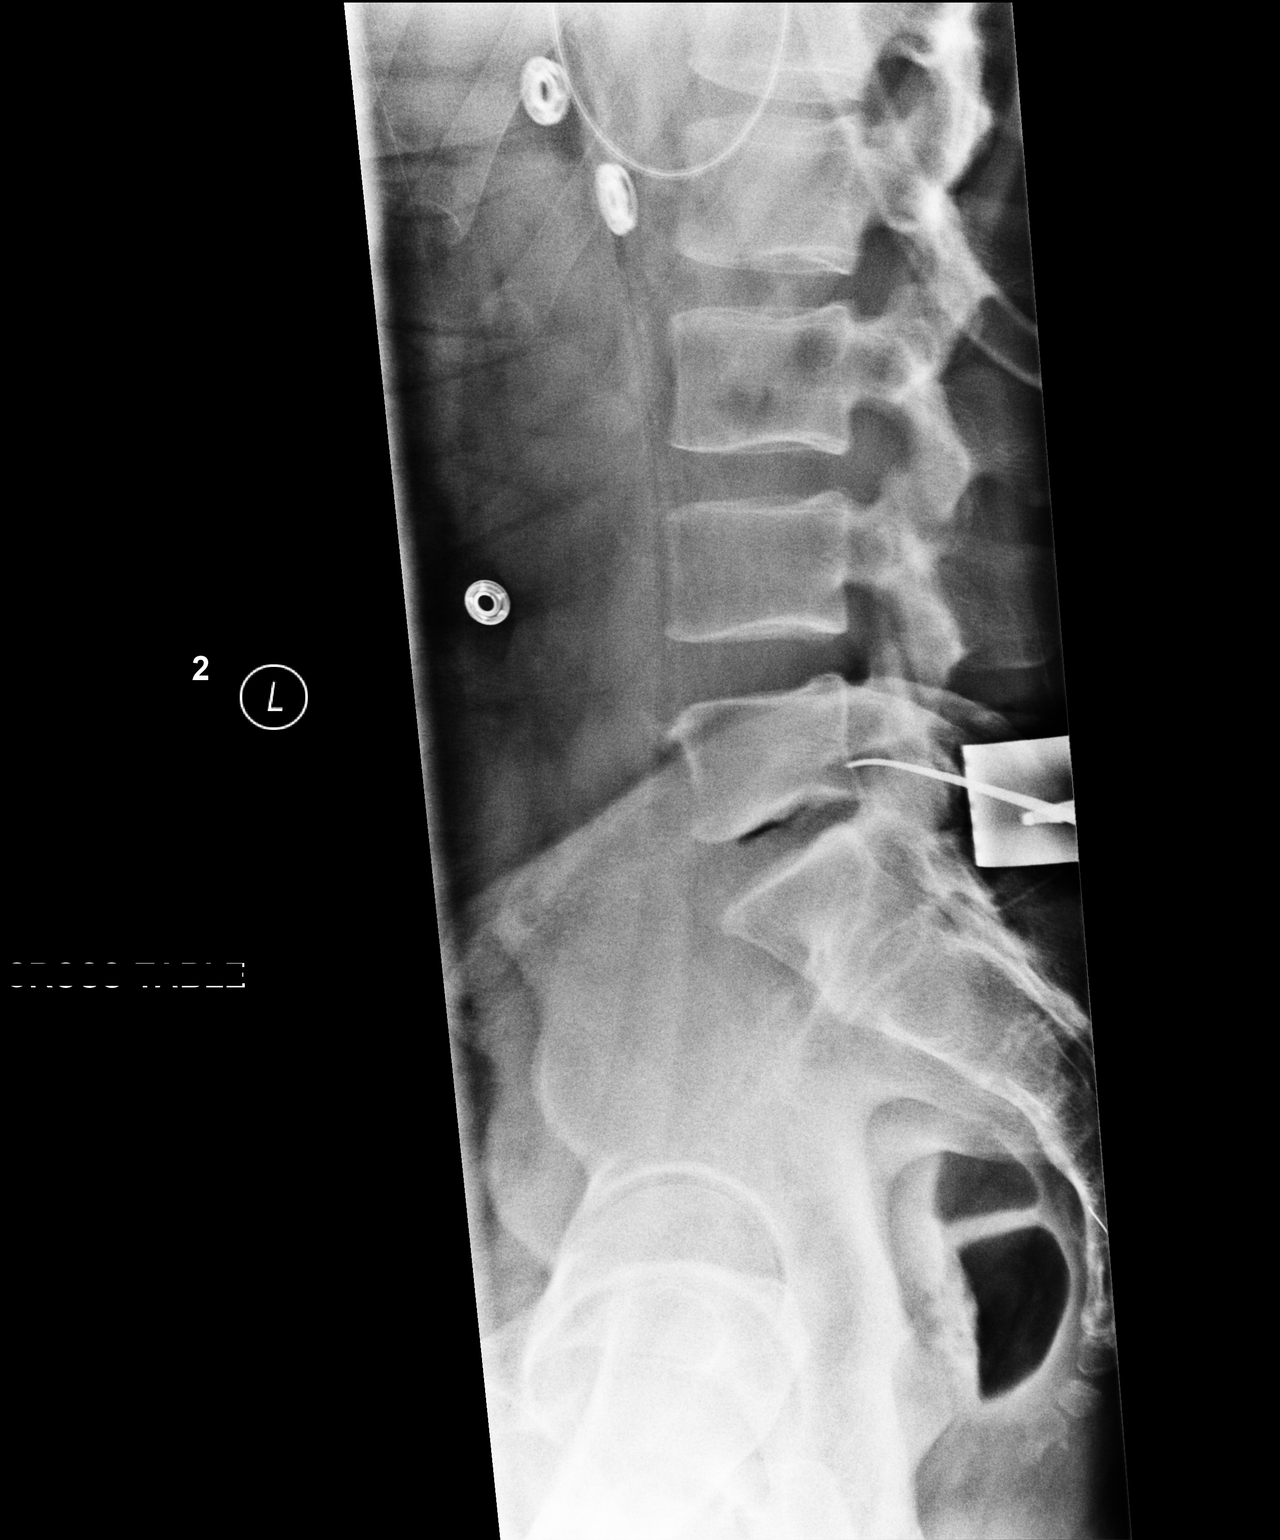

[2 of 2 positions shown; findings below may reference images not displayed]

FINDINGS: Intraop image #1 demonstrates localization instrument in the soft
tissues posterior to the S1 and L5 vertebral bodies. Intraop image
number #2 demonstrates localization instrument at the level of the
L5 vertebral body. No acute osseous abnormality.
IMPRESSION: Image number #2 demonstrates localization instrument at the level of
the L5 vertebral body.

## 2022-11-01 IMAGING — DX DG LUMBAR SPINE 2-3V
3 series · 3 of 3 positions shown · non-contrast
Comparison: Lumbar MRI 01/19/2021

CLINICAL DATA: Preop.  Spine labeling.

EXAM:
LUMBAR SPINE - 2-3 VIEW

[w lumbar spine ap]
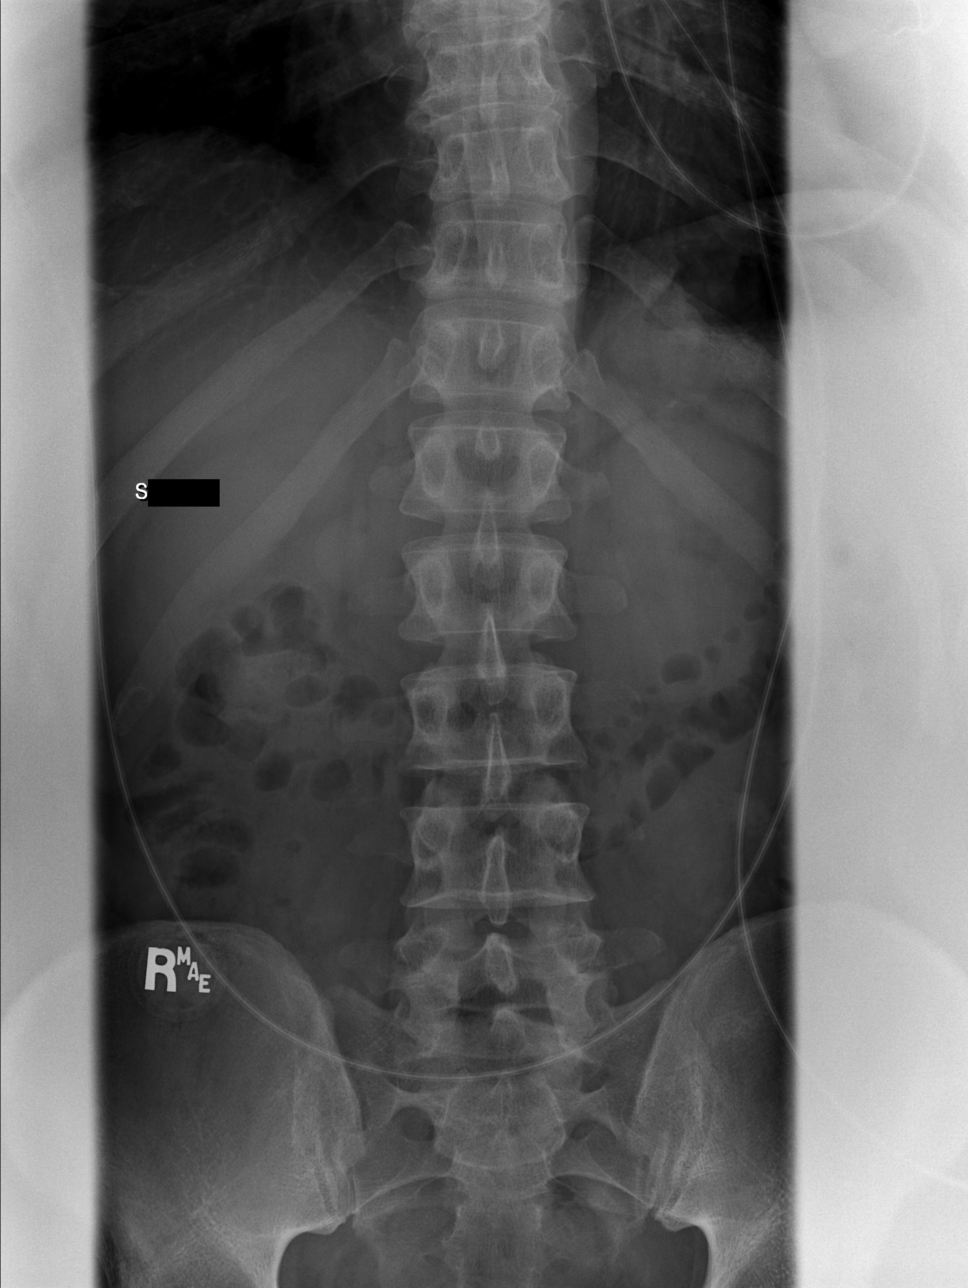

[w lumbar spine lat]
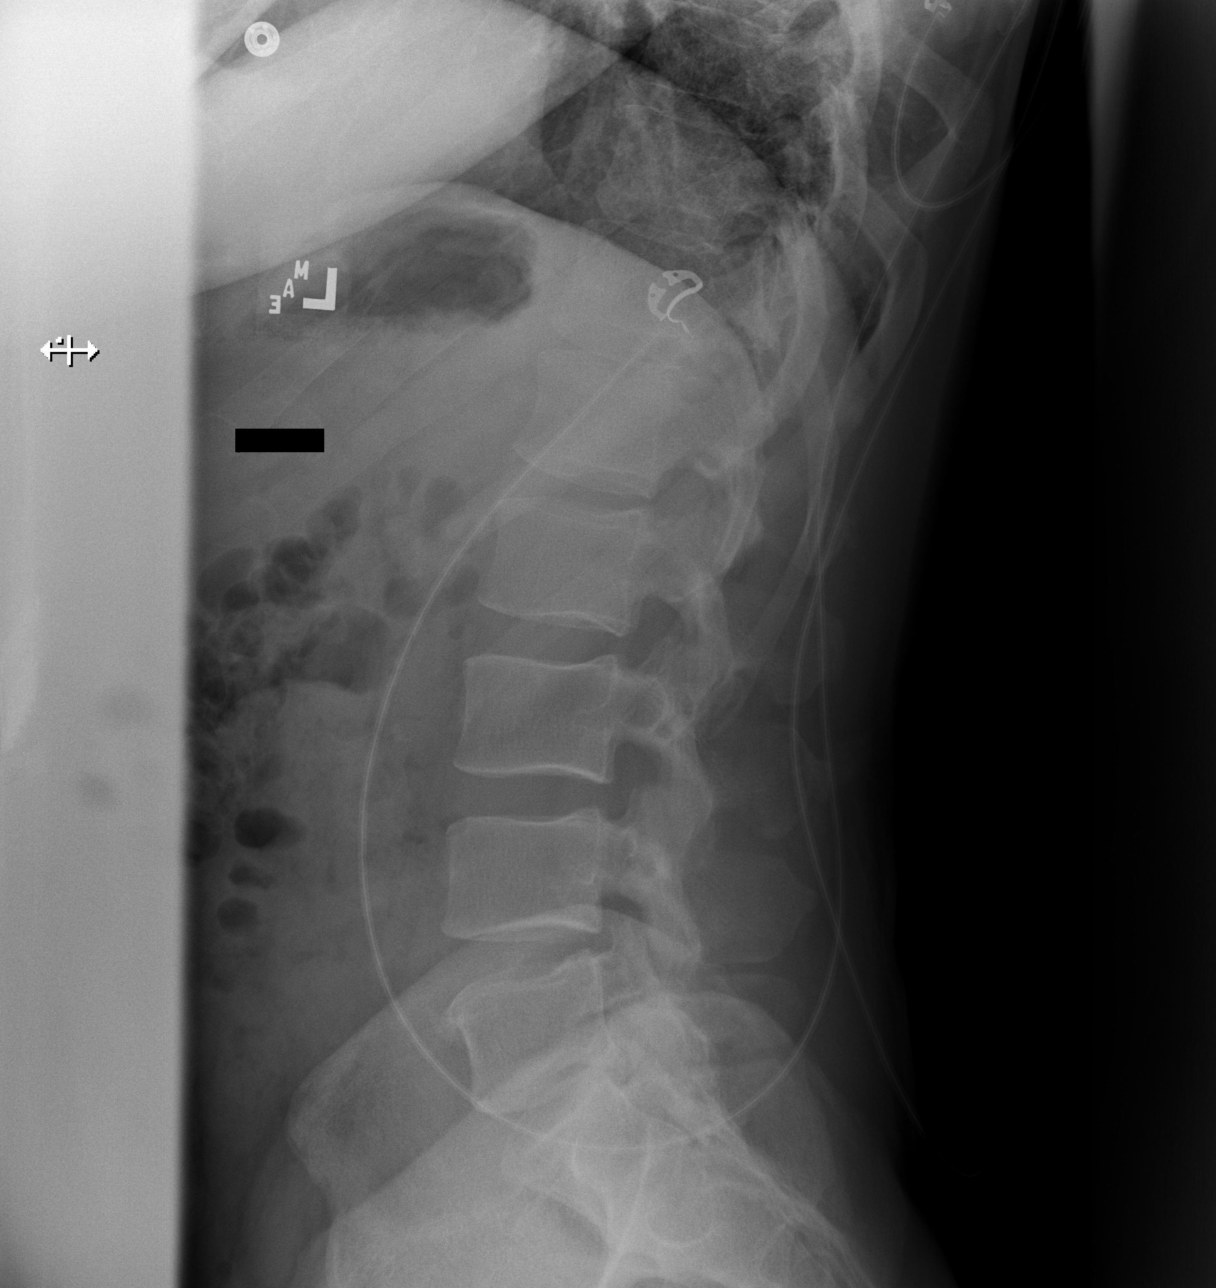

[w lumbar l-5 s-1 spot]
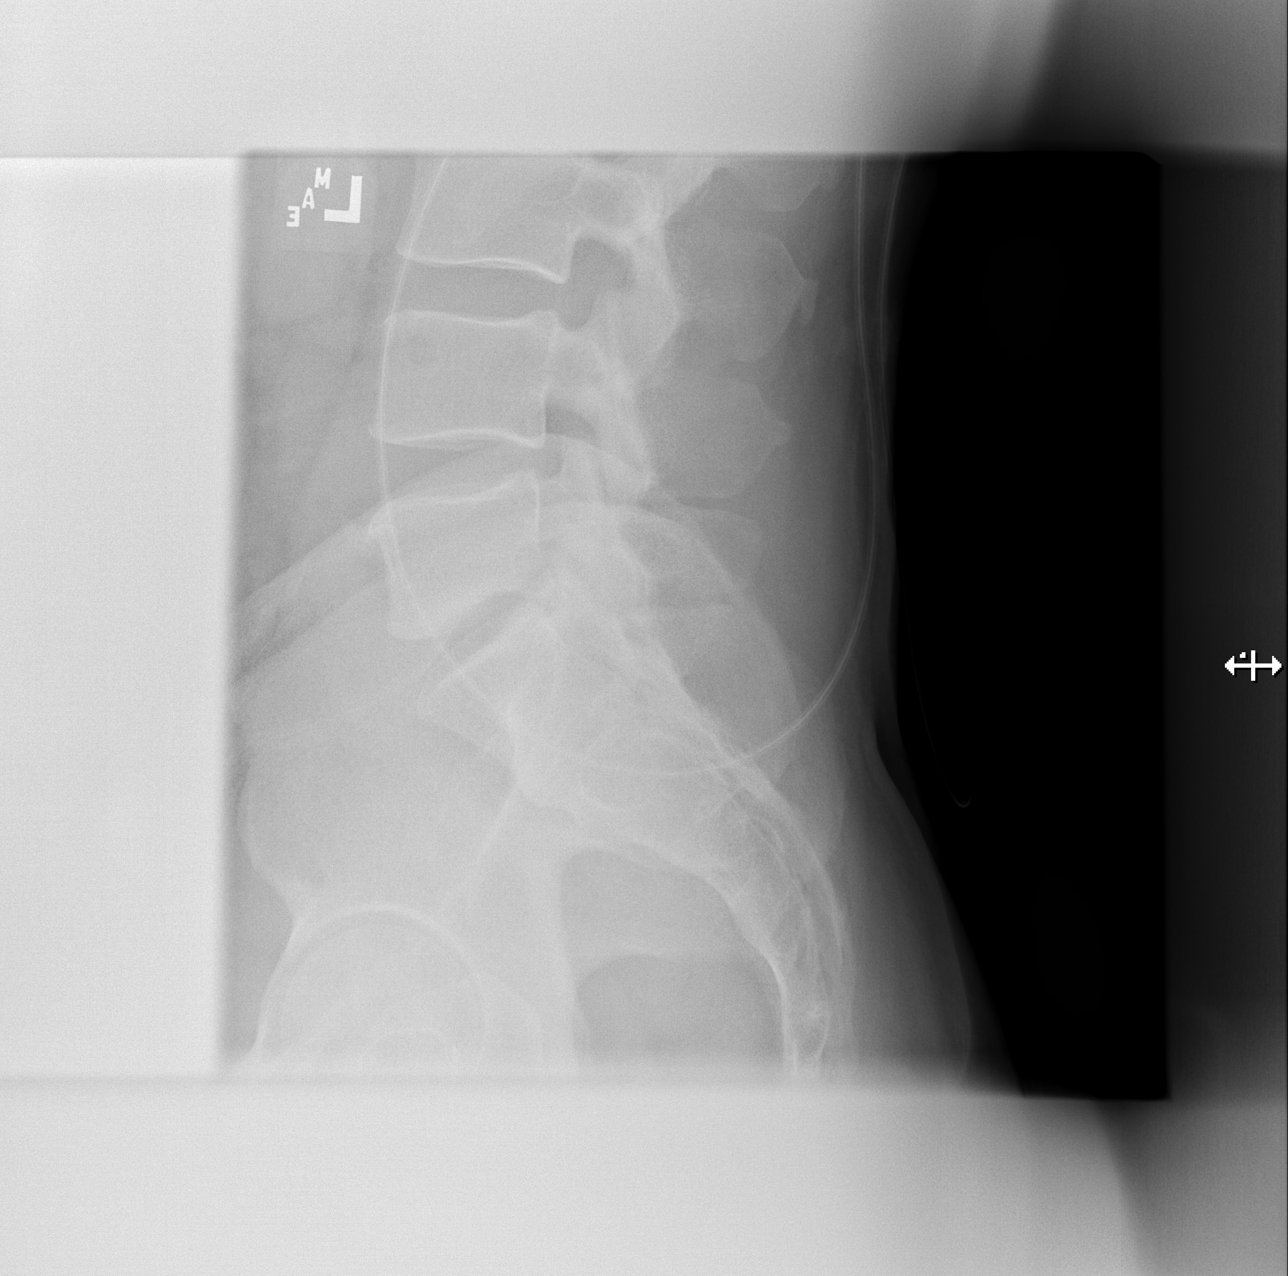

[3 of 3 positions shown; findings below may reference images not displayed]

FINDINGS: Five lumbar type vertebrae based on preoperative imaging. The same
numbering scheme as on the comparison MRI is used with the lowest
open disc space is numbered L5-S1. L5-S1 disc space narrowing and
mild retrolisthesis.
IMPRESSION: Five lumbar type vertebrae as numbered on the lateral view. The same
numbering scheme was used on 01/19/2021 lumbar MRI.

## 2024-03-23 ENCOUNTER — Encounter (HOSPITAL_COMMUNITY): Payer: Self-pay | Admitting: Emergency Medicine

## 2024-03-23 ENCOUNTER — Other Ambulatory Visit: Payer: Self-pay

## 2024-03-23 ENCOUNTER — Emergency Department (HOSPITAL_COMMUNITY)
Admission: EM | Admit: 2024-03-23 | Discharge: 2024-03-24 | Discharge status: Against Medical Advice | Attending: Emergency Medicine | Admitting: Emergency Medicine

## 2024-03-23 DIAGNOSIS — G8929 Other chronic pain: Secondary | ICD-10-CM | POA: Insufficient documentation

## 2024-03-23 DIAGNOSIS — Z5321 Procedure and treatment not carried out due to patient leaving prior to being seen by health care provider: Secondary | ICD-10-CM | POA: Diagnosis not present

## 2024-03-23 DIAGNOSIS — M545 Low back pain, unspecified: Secondary | ICD-10-CM | POA: Insufficient documentation

## 2024-03-23 DIAGNOSIS — N189 Chronic kidney disease, unspecified: Secondary | ICD-10-CM | POA: Diagnosis not present

## 2024-03-23 DIAGNOSIS — M5441 Lumbago with sciatica, right side: Secondary | ICD-10-CM | POA: Diagnosis not present

## 2024-03-23 NOTE — ED Triage Notes (Signed)
 Patient reports chronic low back pain for severAl years worse this week , denies injury .

## 2024-03-23 NOTE — ED Triage Notes (Signed)
 Patient endorses lower back pain for several years, worse since Friday, worse upon standing.  Denies trauma.

## 2024-03-24 ENCOUNTER — Other Ambulatory Visit: Payer: Self-pay

## 2024-03-24 ENCOUNTER — Encounter (HOSPITAL_COMMUNITY): Payer: Self-pay

## 2024-03-24 ENCOUNTER — Emergency Department (HOSPITAL_COMMUNITY)
Admission: EM | Admit: 2024-03-24 | Discharge: 2024-03-24 | Disposition: A | Discharge status: Home / Self Care | Source: Home / Self Care | Attending: Emergency Medicine | Admitting: Emergency Medicine

## 2024-03-24 ENCOUNTER — Emergency Department (HOSPITAL_COMMUNITY)

## 2024-03-24 DIAGNOSIS — N189 Chronic kidney disease, unspecified: Secondary | ICD-10-CM | POA: Insufficient documentation

## 2024-03-24 DIAGNOSIS — M5441 Lumbago with sciatica, right side: Secondary | ICD-10-CM | POA: Insufficient documentation

## 2024-03-24 LAB — COMPREHENSIVE METABOLIC PANEL WITH GFR
ALT: 8 U/L (ref 0–44)
AST: 19 U/L (ref 15–41)
Albumin: 4.3 g/dL (ref 3.5–5.0)
Alkaline Phosphatase: 64 U/L (ref 38–126)
Anion gap: 11 (ref 5–15)
BUN: 21 mg/dL — ABNORMAL HIGH (ref 6–20)
CO2: 25 mmol/L (ref 22–32)
Calcium: 9.3 mg/dL (ref 8.9–10.3)
Chloride: 107 mmol/L (ref 98–111)
Creatinine, Ser: 1.25 mg/dL — ABNORMAL HIGH (ref 0.61–1.24)
GFR, Estimated: >60 mL/min (ref >60)
Glucose, Bld: 115 mg/dL — ABNORMAL HIGH (ref 70–99)
Potassium: 3.7 mmol/L (ref 3.5–5.1)
Sodium: 143 mmol/L (ref 135–145)
Total Bilirubin: 0.6 mg/dL (ref 0.0–1.2)
Total Protein: 6.6 g/dL (ref 6.5–8.1)

## 2024-03-24 LAB — CBC WITH DIFFERENTIAL/PLATELET
Abs Immature Granulocytes: 0.03 K/uL (ref 0.00–0.07)
Basophils Absolute: 0.1 K/uL (ref 0.0–0.1)
Basophils Relative: 1 %
Eosinophils Absolute: 0.4 K/uL (ref 0.0–0.5)
Eosinophils Relative: 3 %
HCT: 48.2 % (ref 39.0–52.0)
Hemoglobin: 16 g/dL (ref 13.0–17.0)
Immature Granulocytes: 0 %
Lymphocytes Relative: 23 %
Lymphs Abs: 2.7 K/uL (ref 0.7–4.0)
MCH: 30.1 pg (ref 26.0–34.0)
MCHC: 33.2 g/dL (ref 30.0–36.0)
MCV: 90.6 fL (ref 80.0–100.0)
Monocytes Absolute: 0.6 K/uL (ref 0.1–1.0)
Monocytes Relative: 5 %
Neutro Abs: 7.9 K/uL — ABNORMAL HIGH (ref 1.7–7.7)
Neutrophils Relative %: 68 %
Platelets: 83 K/uL — ABNORMAL LOW (ref 150–400)
RBC: 5.32 MIL/uL (ref 4.22–5.81)
RDW: 13.7 % (ref 11.5–15.5)
WBC: 11.7 K/uL — ABNORMAL HIGH (ref 4.0–10.5)
nRBC: 0 % (ref 0.0–0.2)

## 2024-03-24 LAB — URINALYSIS, ROUTINE W REFLEX MICROSCOPIC
Bilirubin Urine: NEGATIVE
Glucose, UA: NEGATIVE mg/dL
Hgb urine dipstick: NEGATIVE
Ketones, ur: NEGATIVE mg/dL
Leukocytes,Ua: NEGATIVE
Nitrite: NEGATIVE
Protein, ur: NEGATIVE mg/dL
Specific Gravity, Urine: 1.026 (ref 1.005–1.030)
pH: 5 (ref 5.0–8.0)

## 2024-03-24 MED ORDER — CYCLOBENZAPRINE HCL 10 MG PO TABS
5.0000 mg | ORAL_TABLET | Freq: Once | ORAL | Status: AC
  Administered 2024-03-24: 5 mg via ORAL
  Filled 2024-03-24: qty 1

## 2024-03-24 MED ORDER — PREDNISONE 20 MG PO TABS
40.0000 mg | ORAL_TABLET | Freq: Once | ORAL | Status: AC
  Administered 2024-03-24: 40 mg via ORAL
  Filled 2024-03-24: qty 2

## 2024-03-24 MED ORDER — PREDNISONE 10 MG PO TABS
ORAL_TABLET | ORAL | 0 refills | Status: AC
Start: 2024-03-24 — End: ?

## 2024-03-24 NOTE — ED Notes (Signed)
 Patient transported to MRI

## 2024-03-24 NOTE — Discharge Instructions (Addendum)
 Take the prednisone  as directed, avoid twisting bending or heavy lifting for at least 1 week.  You may also try over-the-counter lidocaine  patches to the affected area.  Please call the neurosurgery group listed to arrange follow-up appointment in 1 week if not improving.

## 2024-03-24 NOTE — ED Notes (Signed)
 LWBS

## 2024-03-24 NOTE — ED Notes (Signed)
Post-void bladder scan showed 0ml

## 2024-03-24 NOTE — ED Triage Notes (Signed)
 Pt arrived via POV c/o chronic lower back pain. Pt reports he has an appointment with Dr Marvine next Tuesday but cannot wait. Pt reports taking his prescribed medications w/o relief. Pt denies any recent injuries. Pt reports pain radiates down his right leg as well and it is located where his previous surgery at the L5-S1 area.

## 2024-03-24 NOTE — ED Provider Notes (Signed)
 [facility] EMERGENCY DEPARTMENT AT [facility] Provider Note   CSN: [id number] Arrival date & time: 03/24/24  1423     Patient presents with: Back Pain   Derrick [name] is a 43 y.o. male.    Back Pain Associated symptoms: no abdominal pain, no chest pain and no fever         Derrick [name] is a 43 y.o. male with medical history of chronic back pain with 2 previous lumbar surgeries in 2023, CKD, GERD who presents to the Emergency Department complaining of new right sided low back pain for several days.  He states he has chronic pain of his lower back since his previous surgeries with chronic numbness of his left 3rd, 4th and 5th toes.  For approximately 1 week, he has been having pressure and pain with standing bending or lifting of his right lower back that radiates into his buttock and into his groin.  His symptoms improve when he is in the prone position with one of his legs slightly bent.  He denies any urinary or bowel incontinence and/or retention, abd pain, or saddle anesthesia's, .  He has a tingling sensation to his right lower leg at the level of his calf.  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  Denies fever chills, nausea or vomiting.    Prior to Admission medications   Medication Sig Start Date End Date Taking? Authorizing Provider  celecoxib (CELEBREX) 200 MG capsule Take 200 mg by mouth 2 (two) times daily as needed. 02/12/24  Yes [provider]  clotrimazole-betamethasone (LOTRISONE) cream Apply 1 Application topically 2 (two) times daily as needed (skin irritation).    [provider]  docusate sodium  (COLACE) 100 MG capsule Take 1 capsule (100 mg total) by mouth 2 (two) times daily as needed for mild constipation. 11/16/21   Duwayne Purchase, MD  omeprazole  (PRILOSEC) 40 MG capsule Take 40 mg by mouth 2 (two) times daily as needed (acid reflux/indigestion.).    [provider]  polyethylene glycol (MIRALAX  /  GLYCOLAX ) 17 g packet Take 17 g by mouth daily. 11/16/21   Duwayne Purchase, MD  predniSONE  (DELTASONE ) 5 MG tablet Take 5 mg by mouth in the morning and at bedtime.    [provider]    Allergies: Alpha-gal and Bee venom    Review of Systems  Constitutional:  Negative for appetite change and fever.  Respiratory:  Negative for shortness of breath.   Cardiovascular:  Negative for chest pain.  Gastrointestinal:  Negative for abdominal pain, constipation, diarrhea, nausea and vomiting.  Genitourinary:  Negative for decreased urine volume, difficulty urinating, flank pain, hematuria, scrotal swelling and testicular pain.  Musculoskeletal:  Positive for back pain. Negative for neck pain and neck stiffness.  Skin:  Negative for rash.    Updated Vital Signs BP 129/85   Pulse 64   Temp 98.5 F (36.9 C) (Oral)   Resp 17   Ht 5' 8 (1.727 m)   Wt 87 kg   SpO2 97%   BMI 29.16 kg/m   Physical Exam Vitals and nursing note reviewed.  Constitutional:      Appearance: Normal appearance. He is not toxic-appearing.  HENT:     Mouth/Throat:     Mouth: Mucous membranes are moist.  Cardiovascular:     Rate and Rhythm: Normal rate and regular rhythm.     Pulses: Normal pulses.  Pulmonary:     Effort: Pulmonary effort is  normal.     Breath sounds: Normal breath sounds.  Abdominal:     General: There is no distension.     Palpations: Abdomen is soft.     Tenderness: There is no abdominal tenderness. There is no right CVA tenderness or left CVA tenderness.  Musculoskeletal:        General: Tenderness present. No swelling or deformity.     Lumbar back: Tenderness present. No swelling or deformity. Positive right straight leg raise test.     Comments: Tender to palpation right lower lumbar spine and right paraspinal muscles.  Positive straight leg raise on the right.  Flexors and extensors are intact.  Gross sensation of the extremity is also intact.  Skin:    General: Skin is warm.      Capillary Refill: Capillary refill takes less than 2 seconds.  Neurological:     General: No focal deficit present.     Mental Status: He is alert.     Motor: No weakness.     (all labs ordered are listed, but only abnormal results are displayed) Labs Reviewed  CBC WITH DIFFERENTIAL/PLATELET - Abnormal; Notable for the following components:      Result Value   WBC 11.7 (*)    Platelets 83 (*)    Neutro Abs 7.9 (*)    All other components within normal limits  COMPREHENSIVE METABOLIC PANEL WITH GFR - Abnormal; Notable for the following components:   Glucose, Bld 115 (*)    BUN 21 (*)    Creatinine, Ser 1.25 (*)    All other components within normal limits  URINALYSIS, ROUTINE W REFLEX MICROSCOPIC    EKG: None  Radiology: MR LUMBAR SPINE WO CONTRAST Result Date: 03/24/2024 CLINICAL DATA:  Low back pain, prior surgery, new symptoms EXAM: MRI LUMBAR SPINE WITHOUT CONTRAST TECHNIQUE: Multiplanar, multisequence MR imaging of the lumbar spine was performed. No intravenous contrast was administered. COMPARISON:  Lumbar spine radiographs 11/16/2021.  MRI 01/19/2021. FINDINGS: Segmentation: Conventional anatomy assumed, with the last open disc space designated L5-S1.Concordant with prior imaging. Alignment: Stable straightening and grade 1 retrolisthesis at L5-S1. Vertebrae: No worrisome osseous lesion, acute fracture or pars defect. Mild chronic endplate degenerative changes at L5-S1. No evidence of discitis or osteomyelitis. Conus medullaris: Extends to the L1 level. The conus and cauda equina appear normal. Paraspinal and other soft tissues: No significant paraspinal findings. Disc levels: No significant disc space findings from T12-L1 through L3-4. The discs are well hydrated with maintained height at those levels. L4-5: Preserved disc height with mild disc desiccation and bulging. There is a left foraminal annular fissure and mild bilateral facet hypertrophy. Mild narrowing of the left  lateral recess and left foramen without nerve root impingement. The spinal canal and right foramen are patent. L5-S1: Interval left laminotomy and discectomy. Mildly progressive loss of disc height with annular disc bulging and endplate osteophytes. No recurrent focal disc herniation identified. There is mild bilateral facet hypertrophy. Mildly progressive narrowing of the foramina and right lateral recess without definite nerve root impingement. Correlate with radicular symptoms. IMPRESSION: 1. Interval left laminotomy and discectomy at L5-S1. No recurrent focal disc herniation identified. Mildly progressive narrowing of the foramina and right lateral recess without definite nerve root impingement. 2. Mild disc bulging and facet hypertrophy at L4-5 without resulting significant spinal stenosis or nerve root impingement. 3. No acute findings. Electronically Signed   By: Elsie Perone M.D.   On: 03/24/2024 18:24      Procedures   Medications  Ordered in the ED - No data to display                                  Medical Decision Making Patient here with chronic low back pain 2 prior surgeries in 2023 to his lower back.  He was having left-sided symptoms at that time and has chronic numbness of his 3rd, 4th and 5th toes on the left.  Recently began having right sided low back pain, no known injury, also has pain of his groin, but denies any urine or bowel incontinence and/or retention.  No saddle anesthesia on exam.  He has pain with passive straight leg raise on the right.  Gross sensation of the extremity is intact.  Differential includes but not limited to acute on chronic low back pain, disc herniation, no recent reported injury or procedures to suggest a spinal abscess, cauda equina is also considered but felt less likely.  Amount and/or Complexity of Data Reviewed Labs: ordered.    Details: Unremarkable Radiology: ordered.    Details: MRI lumbar spine without definite nerve root  impingement or significant spinal stenosis Discussion of management or test interpretation with external provider(s):   Patient with likely sciatica of low back.  Workup without evidence of cauda equina.  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  He states the only thing that helps his back pain is steroids.  Offered prescription for opiate pain medication but patient declines.  I have counseled him on risk involved with persistent steroid use he verbalized understanding.  I recommended that he have close outpatient follow-up with neurosurgery and he is agreeable to plan.  He is ambulatory at time of discharge without ataxia.  return precautions were given.  Risk Prescription drug management.        Final diagnoses:  Acute right-sided low back pain with right-sided sciatica    ED Discharge Orders     None          Herlinda Milling, PA-C 03/27/24 1407    Towana Ozell BROCKS, MD 03/28/24 1050
